# Patient Record
Sex: Female | Born: 1995
Health system: Southern US, Community
[De-identification: ages and names within clinical notes are randomized; demographics above are authoritative.]

## PROBLEM LIST (undated history)

## (undated) DIAGNOSIS — K219 Gastro-esophageal reflux disease without esophagitis: Secondary | ICD-10-CM

---
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
## 2016-07-26 ENCOUNTER — Encounter: Payer: BLUE CROSS/BLUE SHIELD | Admitting: Nurse Practitioner

## 2016-12-05 ENCOUNTER — Encounter: Payer: Self-pay | Admitting: Nurse Practitioner

## 2016-12-05 ENCOUNTER — Ambulatory Visit (INDEPENDENT_AMBULATORY_CARE_PROVIDER_SITE_OTHER): Payer: BLUE CROSS/BLUE SHIELD | Admitting: Nurse Practitioner

## 2016-12-05 VITALS — BP 126/81 | HR 106 | Temp 98.3°F | Ht 62.0 in | Wt 155.0 lb

## 2016-12-05 DIAGNOSIS — N926 Irregular menstruation, unspecified: Secondary | ICD-10-CM | POA: Diagnosis not present

## 2016-12-05 DIAGNOSIS — Z3201 Encounter for pregnancy test, result positive: Secondary | ICD-10-CM | POA: Diagnosis not present

## 2016-12-05 NOTE — Progress Notes (Signed)
   Subjective:    Patient ID: Theresa Hansen, female    DOB: April 23, 1996, 21 y.o.   MRN: [id number]  HPI Patient comes in today c/o nausea and vomiting with slight abdominal pain- started about 1 1/2 months ago. LMP 09/24/16 and was very light. SHe does admitt to unprotected sex.    Review of Systems  Constitutional: Negative.   HENT: Negative.   Respiratory: Negative.   Cardiovascular: Negative.   Gastrointestinal: Positive for nausea and vomiting.  Genitourinary: Negative.   Neurological: Negative.   Psychiatric/Behavioral: Negative.   All other systems reviewed and are negative.      Objective:   Physical Exam  Constitutional: She is oriented to person, place, and time. She appears well-developed and well-nourished. No distress.  Cardiovascular: Normal rate and regular rhythm.   Pulmonary/Chest: Effort normal and breath sounds normal.  Abdominal: Soft. Bowel sounds are normal.  Neurological: She is alert and oriented to person, place, and time.  Skin: Skin is warm.  Psychiatric: She has a normal mood and affect. Her behavior is normal. Judgment and thought content normal.    BP 126/81   Pulse (!) 106   Temp 98.3 F (36.8 C) (Oral)   Ht 5\' 2"  (1.575 m)   Wt 155 lb (70.3 kg)   BMI 28.35 kg/m   Urine pregnancy positive     Assessment & Plan:   1. Missed periods   2. Positive pregnancy test    EDD February 19,2019 Orders Placed This Encounter  Procedures  . Pregnancy, urine  . Ambulatory referral to Obstetrics / Gynecology    Referral Priority:   Routine    Referral Type:   Consultation    Referral Reason:   Specialty Services Required    Requested Specialty:   Obstetrics and Gynecology    Number of Visits Requested:   1   Gummy prenatal vitamins OTC RTO prn  Mary-Margaret Daphine DeutscherMartin, FNP

## 2016-12-05 NOTE — Patient Instructions (Signed)
Eating Plan for Pregnant Women While you are pregnant, your body will require additional nutrition to help support your growing baby. It is recommended that you consume:  150 additional calories each day during your first trimester.  300 additional calories each day during your second trimester.  300 additional calories each day during your third trimester.  Eating a healthy, well-balanced diet is very important for your health and for your baby's health. You also have a higher need for some vitamins and minerals, such as folic acid, calcium, iron, and vitamin D. What do I need to know about eating during pregnancy?  Do not try to lose weight or go on a diet during pregnancy.  Choose healthy, nutritious foods. Choose  of a sandwich with a glass of milk instead of a candy bar or a high-calorie sugar-sweetened beverage.  Limit your overall intake of foods that have "empty calories." These are foods that have little nutritional value, such as sweets, desserts, candies, sugar-sweetened beverages, and fried foods.  Eat a variety of foods, especially fruits and vegetables.  Take a prenatal vitamin to help meet the additional needs during pregnancy, specifically for folic acid, iron, calcium, and vitamin D.  Remember to stay active. Ask your health care provider for exercise recommendations that are specific to you.  Practice good food safety and cleanliness, such as washing your hands before you eat and after you prepare raw meat. This helps to prevent foodborne illnesses, such as listeriosis, that can be very dangerous for your baby. Ask your health care provider for more information about listeriosis. What does 150 extra calories look like? Healthy options for an additional 150 calories each day could be any of the following:  Plain low-fat yogurt (6-8 oz) with  cup of berries.  1 apple with 2 teaspoons of peanut butter.  Cut-up vegetables with  cup of hummus.  Low-fat chocolate  milk (8 oz or 1 cup).  1 string cheese with 1 medium orange.   of a peanut butter and jelly sandwich on whole-wheat bread (1 tsp of peanut butter).  For 300 calories, you could eat two of those healthy options each day. What is a healthy amount of weight to gain? The recommended amount of weight for you to gain is based on your pre-pregnancy BMI. If your pre-pregnancy BMI was:  Less than 18 (underweight), you should gain 28-40 lb.  18-24.9 (normal), you should gain 25-35 lb.  25-29.9 (overweight), you should gain 15-25 lb.  Greater than 30 (obese), you should gain 11-20 lb.  What if I am having twins or multiples? Generally, pregnant women who will be having twins or multiples may need to increase their daily calories by 300-600 calories each day. The recommended range for total weight gain is 25-54 lb, depending on your pre-pregnancy BMI. Talk with your health care provider for specific guidance about additional nutritional needs, weight gain, and exercise during your pregnancy. What foods can I eat? Grains Any grains. Try to choose whole grains, such as whole-wheat bread, oatmeal, or brown rice. Vegetables Any vegetables. Try to eat a variety of colors and types of vegetables to get a full range of vitamins and minerals. Remember to wash your vegetables well before eating. Fruits Any fruits. Try to eat a variety of colors and types of fruit to get a full range of vitamins and minerals. Remember to wash your fruits well before eating. Meats and Other Protein Sources Lean meats, including chicken, turkey, fish, and lean cuts of beef, veal,   or pork. Make sure that all meats are cooked to "well done." Tofu. Tempeh. Beans. Eggs. Peanut butter and other nut butters. Seafood, such as shrimp, crab, and lobster. If you choose fish, select types that are higher in omega-3 fatty acids, including salmon, herring, mussels, trout, sardines, and pollock. Make sure that all meats are cooked to  food-safe temperatures. Dairy Pasteurized milk and milk alternatives. Pasteurized yogurt and pasteurized cheese. Cottage cheese. Sour cream. Beverages Water. Juices that contain 100% fruit juice or vegetable juice. Caffeine-free teas and decaffeinated coffee. Drinks that contain caffeine are okay to drink, but it is better to avoid caffeine. Keep your total caffeine intake to less than 200 mg each day (12 oz of coffee, tea, or soda) or as directed by your health care provider. Condiments Any pasteurized condiments. Sweets and Desserts Any sweets and desserts. Fats and Oils Any fats and oils. The items listed above may not be a complete list of recommended foods or beverages. Contact your dietitian for more options. What foods are not recommended? Vegetables Unpasteurized (raw) vegetable juices. Fruits Unpasteurized (raw) fruit juices. Meats and Other Protein Sources Cured meats that have nitrates, such as bacon, salami, and hotdogs. Luncheon meats, bologna, or other deli meats (unless they are reheated until they are steaming hot). Refrigerated pate, meat spreads from a meat counter, smoked seafood that is found in the refrigerated section of a store. Raw fish, such as sushi or sashimi. High mercury content fish, such as tilefish, shark, swordfish, and king mackerel. Raw meats, such as tuna or beef tartare. Undercooked meats and poultry. Make sure that all meats are cooked to food-safe temperatures. Dairy Unpasteurized (raw) milk and any foods that have raw milk in them. Soft cheeses, such as feta, queso blanco, queso fresco, Brie, Camembert cheeses, blue-veined cheeses, and Panela cheese (unless it is made with pasteurized milk, which must be stated on the label). Beverages Alcohol. Sugar-sweetened beverages, such as sodas, teas, or energy drinks. Condiments Homemade fermented foods and drinks, such as pickles, sauerkraut, or kombucha drinks. (Store-bought pasteurized versions of these are  okay.) Other Salads that are made in the store, such as ham salad, chicken salad, egg salad, tuna salad, and seafood salad. The items listed above may not be a complete list of foods and beverages to avoid. Contact your dietitian for more information. This information is not intended to replace advice given to you by your health care provider. Make sure you discuss any questions you have with your health care provider. Document Released: 02/14/2014 Document Revised: 10/08/2015 Document Reviewed: 10/15/2013 Elsevier Interactive Patient Education  2018 [employer] Inc[employer]   

## 2016-12-07 LAB — PREGNANCY, URINE: Preg Test, Ur: POSITIVE — AB

## 2016-12-13 ENCOUNTER — Telehealth: Payer: Self-pay | Admitting: Nurse Practitioner

## 2016-12-13 NOTE — Telephone Encounter (Signed)
lmtcb To inform pt that referral is in process Dr. Corliss BlackerMcNeill works Family Medicine, she does not work OB/GYN

## 2016-12-13 NOTE — Telephone Encounter (Signed)
Aware that this has been initiated and being worked on

## 2016-12-27 DIAGNOSIS — N911 Secondary amenorrhea: Secondary | ICD-10-CM | POA: Diagnosis not present

## 2016-12-27 DIAGNOSIS — Z363 Encounter for antenatal screening for malformations: Secondary | ICD-10-CM | POA: Diagnosis not present

## 2016-12-29 DIAGNOSIS — Z3A17 17 weeks gestation of pregnancy: Secondary | ICD-10-CM | POA: Diagnosis not present

## 2016-12-29 DIAGNOSIS — Z3480 Encounter for supervision of other normal pregnancy, unspecified trimester: Secondary | ICD-10-CM | POA: Diagnosis not present

## 2016-12-29 DIAGNOSIS — Z3685 Encounter for antenatal screening for Streptococcus B: Secondary | ICD-10-CM | POA: Diagnosis not present

## 2016-12-29 DIAGNOSIS — Z3401 Encounter for supervision of normal first pregnancy, first trimester: Secondary | ICD-10-CM | POA: Diagnosis not present

## 2016-12-29 LAB — OB RESULTS CONSOLE HEPATITIS B SURFACE ANTIGEN: Hepatitis B Surface Ag: NEGATIVE

## 2016-12-29 LAB — OB RESULTS CONSOLE GC/CHLAMYDIA
Chlamydia: NEGATIVE
Gonorrhea: NEGATIVE

## 2016-12-29 LAB — OB RESULTS CONSOLE ABO/RH: RH Type: NEGATIVE

## 2016-12-29 LAB — OB RESULTS CONSOLE RPR: RPR: NONREACTIVE

## 2016-12-29 LAB — OB RESULTS CONSOLE RUBELLA ANTIBODY, IGM: Rubella: IMMUNE

## 2016-12-29 LAB — OB RESULTS CONSOLE HIV ANTIBODY (ROUTINE TESTING): HIV: NONREACTIVE

## 2016-12-29 LAB — OB RESULTS CONSOLE ANTIBODY SCREEN: Antibody Screen: NEGATIVE

## 2017-01-09 DIAGNOSIS — Z3A19 19 weeks gestation of pregnancy: Secondary | ICD-10-CM | POA: Diagnosis not present

## 2017-01-09 DIAGNOSIS — Z3480 Encounter for supervision of other normal pregnancy, unspecified trimester: Secondary | ICD-10-CM | POA: Diagnosis not present

## 2017-01-09 DIAGNOSIS — Z113 Encounter for screening for infections with a predominantly sexual mode of transmission: Secondary | ICD-10-CM | POA: Diagnosis not present

## 2017-01-09 DIAGNOSIS — Z363 Encounter for antenatal screening for malformations: Secondary | ICD-10-CM | POA: Diagnosis not present

## 2017-02-09 DIAGNOSIS — Z362 Encounter for other antenatal screening follow-up: Secondary | ICD-10-CM | POA: Diagnosis not present

## 2017-03-06 DIAGNOSIS — Z3480 Encounter for supervision of other normal pregnancy, unspecified trimester: Secondary | ICD-10-CM | POA: Diagnosis not present

## 2017-03-06 DIAGNOSIS — Z23 Encounter for immunization: Secondary | ICD-10-CM | POA: Diagnosis not present

## 2017-03-06 DIAGNOSIS — Z3A27 27 weeks gestation of pregnancy: Secondary | ICD-10-CM | POA: Diagnosis not present

## 2017-03-06 DIAGNOSIS — O360920 Maternal care for other rhesus isoimmunization, second trimester, not applicable or unspecified: Secondary | ICD-10-CM | POA: Diagnosis not present

## 2017-04-30 LAB — OB RESULTS CONSOLE GBS: GBS: NEGATIVE

## 2017-05-04 DIAGNOSIS — Z3685 Encounter for antenatal screening for Streptococcus B: Secondary | ICD-10-CM | POA: Diagnosis not present

## 2017-05-04 DIAGNOSIS — Z113 Encounter for screening for infections with a predominantly sexual mode of transmission: Secondary | ICD-10-CM | POA: Diagnosis not present

## 2017-05-17 ENCOUNTER — Encounter (HOSPITAL_COMMUNITY): Payer: Self-pay | Admitting: *Deleted

## 2017-05-17 ENCOUNTER — Telehealth (HOSPITAL_COMMUNITY): Payer: Self-pay | Admitting: *Deleted

## 2017-05-17 NOTE — Telephone Encounter (Signed)
Preadmission screen  

## 2017-05-29 NOTE — H&P (Signed)
Theresa Hansen is a 22 y.o. female presenting for IOL w/favorable cervix. OB History    Gravida Para Term Preterm AB Living   1             SAB TAB Ectopic Multiple Live Births                 No past medical history on file. No past surgical history on file. Family History: family history includes Diabetes in her paternal grandmother; Heart disease in her maternal grandfather and maternal grandmother; Hyperlipidemia in her father and mother; Hypertension in her maternal grandfather, maternal grandmother, paternal grandfather, and paternal grandmother; Seizures in her maternal grandmother; Stroke in her paternal grandmother; Thyroid disease in her paternal grandmother. Social History:  reports that  has never smoked. she has never used smokeless tobacco. Her alcohol and drug histories are not on file.     Maternal Diabetes: No Genetic Screening: Normal Maternal Ultrasounds/Referrals: Normal Fetal Ultrasounds or other Referrals:  None Maternal Substance Abuse:  No Significant Maternal Medications:  None Significant Maternal Lab Results:  None Other Comments:  None  ROS History   Last menstrual period 08/27/2016. Exam Physical Exam  (from clinic) NAD, A&O NWOB Abd soft, nondistended, gravid SVE3/70/-2  Prenatal labs: ABO, Rh: O/Negative/-- (08/16 0000) Antibody: Negative (08/16 0000) Rubella: Immune (08/16 0000) RPR: Nonreactive (08/16 0000)  HBsAg: Negative (08/16 0000)  HIV: Non-reactive (08/16 0000)  GBS: Negative (12/16 0000)   Assessment/Plan: 21yo G1P0 @ 39.3wga presenting for IOL w/favorable cervix.  RH neg - s/p rhogam @ 28wga, for eval pp. [facility] Plan pitocin/AROM. GBS neg.     Theresa Hansen 05/29/2017, 10:08 AM

## 2017-05-30 ENCOUNTER — Encounter (HOSPITAL_COMMUNITY): Payer: Self-pay

## 2017-05-30 ENCOUNTER — Inpatient Hospital Stay (HOSPITAL_COMMUNITY)
Admission: RE | Admit: 2017-05-30 | Discharge: 2017-06-02 | DRG: 787 | Disposition: A | Discharge status: Home / Self Care | Payer: BLUE CROSS/BLUE SHIELD | Source: Ambulatory Visit | Attending: Obstetrics and Gynecology | Admitting: Obstetrics and Gynecology

## 2017-05-30 ENCOUNTER — Inpatient Hospital Stay (HOSPITAL_COMMUNITY): Payer: BLUE CROSS/BLUE SHIELD | Admitting: Anesthesiology

## 2017-05-30 ENCOUNTER — Encounter (HOSPITAL_COMMUNITY)
Admission: RE | Disposition: A | Discharge status: Home / Self Care | Payer: Self-pay | Source: Ambulatory Visit | Attending: Obstetrics and Gynecology

## 2017-05-30 DIAGNOSIS — O26893 Other specified pregnancy related conditions, third trimester: Principal | ICD-10-CM | POA: Diagnosis present

## 2017-05-30 DIAGNOSIS — Z3A39 39 weeks gestation of pregnancy: Secondary | ICD-10-CM | POA: Diagnosis not present

## 2017-05-30 DIAGNOSIS — Z6791 Unspecified blood type, Rh negative: Secondary | ICD-10-CM | POA: Diagnosis not present

## 2017-05-30 DIAGNOSIS — Z3490 Encounter for supervision of normal pregnancy, unspecified, unspecified trimester: Secondary | ICD-10-CM

## 2017-05-30 DIAGNOSIS — Z885 Allergy status to narcotic agent status: Secondary | ICD-10-CM | POA: Diagnosis not present

## 2017-05-30 LAB — TYPE AND SCREEN
ABO/RH(D): O NEG
Antibody Screen: NEGATIVE

## 2017-05-30 LAB — CBC
HCT: 35.3 % — ABNORMAL LOW (ref 36.0–46.0)
HEMOGLOBIN: 11.8 g/dL — AB (ref 12.0–15.0)
MCH: 28.6 pg (ref 26.0–34.0)
MCHC: 33.4 g/dL (ref 30.0–36.0)
MCV: 85.7 fL (ref 78.0–100.0)
PLATELETS: 210 10*3/uL (ref 150–400)
RBC: 4.12 MIL/uL (ref 3.87–5.11)
RDW: 13.1 % (ref 11.5–15.5)
WBC: 14 10*3/uL — AB (ref 4.0–10.5)

## 2017-05-30 LAB — RPR: RPR: NONREACTIVE

## 2017-05-30 LAB — ABO/RH: ABO/RH(D): O NEG

## 2017-05-30 SURGERY — Surgical Case
Anesthesia: Epidural | Site: Abdomen

## 2017-05-30 MED ORDER — EPHEDRINE 5 MG/ML INJ: 10.0000 mg | INTRAVENOUS | Status: DC | PRN

## 2017-05-30 MED ORDER — DEXAMETHASONE SODIUM PHOSPHATE 10 MG/ML IJ SOLN
INTRAMUSCULAR | Status: DC | PRN
  Administered 2017-05-30: 5 mg via INTRAVENOUS

## 2017-05-30 MED ORDER — DIPHENHYDRAMINE HCL 50 MG/ML IJ SOLN: 12.5000 mg | INTRAMUSCULAR | Status: DC | PRN

## 2017-05-30 MED ORDER — SODIUM CHLORIDE 0.9 % IR SOLN
Status: DC | PRN
  Administered 2017-05-30: 1

## 2017-05-30 MED ORDER — LIDOCAINE HCL (PF) 1 % IJ SOLN: 30.0000 mL | INTRAMUSCULAR | Status: DC | PRN

## 2017-05-30 MED ORDER — CEFAZOLIN SODIUM-DEXTROSE 2-4 GM/100ML-% IV SOLN
INTRAVENOUS | Status: AC
  Filled 2017-05-30: qty 100

## 2017-05-30 MED ORDER — CEFAZOLIN (ANCEF) 1 G IV SOLR: 2.0000 g | INTRAVENOUS | Status: DC

## 2017-05-30 MED ORDER — MEPERIDINE HCL 25 MG/ML IJ SOLN
INTRAMUSCULAR | Status: DC | PRN
  Administered 2017-05-30 (×2): 12.5 mg via INTRAVENOUS

## 2017-05-30 MED ORDER — MORPHINE SULFATE (PF) 0.5 MG/ML IJ SOLN
INTRAMUSCULAR | Status: DC | PRN
  Administered 2017-05-30: 4 mg via EPIDURAL

## 2017-05-30 MED ORDER — OXYTOCIN 10 UNIT/ML IJ SOLN
INTRAMUSCULAR | Status: AC
  Filled 2017-05-30: qty 4

## 2017-05-30 MED ORDER — LACTATED RINGERS IV SOLN: INTRAVENOUS | Status: DC

## 2017-05-30 MED ORDER — ONDANSETRON HCL 4 MG/2ML IJ SOLN
INTRAMUSCULAR | Status: AC
Start: 2017-05-30 — End: 2017-05-30
  Filled 2017-05-30: qty 2

## 2017-05-30 MED ORDER — LIDOCAINE-EPINEPHRINE (PF) 2 %-1:200000 IJ SOLN
INTRAMUSCULAR | Status: DC | PRN
  Administered 2017-05-30 (×4): 5 mL via EPIDURAL

## 2017-05-30 MED ORDER — FENTANYL 2.5 MCG/ML BUPIVACAINE 1/10 % EPIDURAL INFUSION (WH - ANES)
14.0000 mL/h | INTRAMUSCULAR | Status: DC | PRN
  Administered 2017-05-30 (×2): 14 mL/h via EPIDURAL
  Filled 2017-05-30 (×2): qty 100

## 2017-05-30 MED ORDER — OXYTOCIN 10 UNIT/ML IJ SOLN
INTRAVENOUS | Status: DC | PRN
  Administered 2017-05-30: 40 [IU] via INTRAVENOUS

## 2017-05-30 MED ORDER — TERBUTALINE SULFATE 1 MG/ML IJ SOLN: 0.2500 mg | Freq: Once | INTRAMUSCULAR | Status: DC | PRN

## 2017-05-30 MED ORDER — ACETAMINOPHEN 500 MG PO TABS: 1000.0000 mg | ORAL_TABLET | Freq: Four times a day (QID) | ORAL | Status: DC

## 2017-05-30 MED ORDER — MEPERIDINE HCL 25 MG/ML IJ SOLN
INTRAMUSCULAR | Status: AC
  Filled 2017-05-30: qty 1

## 2017-05-30 MED ORDER — METOCLOPRAMIDE HCL 5 MG/ML IJ SOLN: 10.0000 mg | Freq: Once | INTRAMUSCULAR | Status: DC | PRN

## 2017-05-30 MED ORDER — MEPERIDINE HCL 25 MG/ML IJ SOLN: 6.2500 mg | INTRAMUSCULAR | Status: DC | PRN

## 2017-05-30 MED ORDER — LACTATED RINGERS IV SOLN: 500.0000 mL | Freq: Once | INTRAVENOUS | Status: DC

## 2017-05-30 MED ORDER — ONDANSETRON HCL 4 MG/2ML IJ SOLN
INTRAMUSCULAR | Status: DC | PRN
  Administered 2017-05-30: 4 mg via INTRAVENOUS

## 2017-05-30 MED ORDER — CALCIUM CARBONATE ANTACID 500 MG PO CHEW
2.0000 | CHEWABLE_TABLET | Freq: Two times a day (BID) | ORAL | Status: DC
  Administered 2017-05-30: 400 mg via ORAL
  Filled 2017-05-30: qty 2

## 2017-05-30 MED ORDER — PHENYLEPHRINE 40 MCG/ML (10ML) SYRINGE FOR IV PUSH (FOR BLOOD PRESSURE SUPPORT)
PREFILLED_SYRINGE | INTRAVENOUS | Status: AC
Start: 2017-05-30 — End: 2017-05-30
  Filled 2017-05-30: qty 20

## 2017-05-30 MED ORDER — FLEET ENEMA 7-19 GM/118ML RE ENEM: 1.0000 | ENEMA | RECTAL | Status: DC | PRN

## 2017-05-30 MED ORDER — DEXAMETHASONE SODIUM PHOSPHATE 10 MG/ML IJ SOLN
INTRAMUSCULAR | Status: AC
  Filled 2017-05-30: qty 1

## 2017-05-30 MED ORDER — OXYTOCIN 40 UNITS IN LACTATED RINGERS INFUSION - SIMPLE MED
1.0000 m[IU]/min | INTRAVENOUS | Status: DC
  Administered 2017-05-30: 2 m[IU]/min via INTRAVENOUS
  Filled 2017-05-30: qty 1000

## 2017-05-30 MED ORDER — SODIUM CHLORIDE 0.9% FLUSH: 3.0000 mL | INTRAVENOUS | Status: DC | PRN

## 2017-05-30 MED ORDER — SCOPOLAMINE 1 MG/3DAYS TD PT72: 1.0000 | MEDICATED_PATCH | Freq: Once | TRANSDERMAL | Status: DC

## 2017-05-30 MED ORDER — KETOROLAC TROMETHAMINE 30 MG/ML IJ SOLN
30.0000 mg | Freq: Four times a day (QID) | INTRAMUSCULAR | Status: DC | PRN
  Administered 2017-05-31: 30 mg via INTRAMUSCULAR

## 2017-05-30 MED ORDER — OXYTOCIN 40 UNITS IN LACTATED RINGERS INFUSION - SIMPLE MED: 2.5000 [IU]/h | INTRAVENOUS | Status: DC

## 2017-05-30 MED ORDER — CEFAZOLIN SODIUM-DEXTROSE 2-4 GM/100ML-% IV SOLN
2.0000 g | INTRAVENOUS | Status: AC
  Administered 2017-05-30: 2 g via INTRAVENOUS

## 2017-05-30 MED ORDER — LACTATED RINGERS IV SOLN
INTRAVENOUS | Status: DC | PRN
  Administered 2017-05-30: 22:00:00 via INTRAVENOUS

## 2017-05-30 MED ORDER — LACTATED RINGERS IV SOLN: 500.0000 mL | INTRAVENOUS | Status: DC | PRN

## 2017-05-30 MED ORDER — NALOXONE HCL 0.4 MG/ML IJ SOLN: 0.4000 mg | INTRAMUSCULAR | Status: DC | PRN

## 2017-05-30 MED ORDER — KETOROLAC TROMETHAMINE 30 MG/ML IJ SOLN: 30.0000 mg | Freq: Four times a day (QID) | INTRAMUSCULAR | Status: DC | PRN

## 2017-05-30 MED ORDER — NALOXONE HCL 4 MG/10ML IJ SOLN
1.0000 ug/kg/h | INTRAVENOUS | Status: DC | PRN
  Filled 2017-05-30: qty 5

## 2017-05-30 MED ORDER — ONDANSETRON HCL 4 MG/2ML IJ SOLN: 4.0000 mg | Freq: Three times a day (TID) | INTRAMUSCULAR | Status: DC | PRN

## 2017-05-30 MED ORDER — PHENYLEPHRINE 40 MCG/ML (10ML) SYRINGE FOR IV PUSH (FOR BLOOD PRESSURE SUPPORT): 80.0000 ug | PREFILLED_SYRINGE | INTRAVENOUS | Status: DC | PRN

## 2017-05-30 MED ORDER — FENTANYL CITRATE (PF) 100 MCG/2ML IJ SOLN: 25.0000 ug | INTRAMUSCULAR | Status: DC | PRN

## 2017-05-30 MED ORDER — ACETAMINOPHEN 325 MG PO TABS: 650.0000 mg | ORAL_TABLET | ORAL | Status: DC | PRN

## 2017-05-30 MED ORDER — DIPHENHYDRAMINE HCL 25 MG PO CAPS
25.0000 mg | ORAL_CAPSULE | ORAL | Status: DC | PRN
  Filled 2017-05-30: qty 1

## 2017-05-30 MED ORDER — SCOPOLAMINE 1 MG/3DAYS TD PT72
MEDICATED_PATCH | TRANSDERMAL | Status: AC
  Filled 2017-05-30: qty 1

## 2017-05-30 MED ORDER — MORPHINE SULFATE (PF) 0.5 MG/ML IJ SOLN
INTRAMUSCULAR | Status: AC
  Filled 2017-05-30: qty 10

## 2017-05-30 MED ORDER — SCOPOLAMINE 1 MG/3DAYS TD PT72
MEDICATED_PATCH | TRANSDERMAL | Status: DC | PRN
  Administered 2017-05-30: 1 via TRANSDERMAL

## 2017-05-30 MED ORDER — PHENYLEPHRINE 40 MCG/ML (10ML) SYRINGE FOR IV PUSH (FOR BLOOD PRESSURE SUPPORT)
80.0000 ug | PREFILLED_SYRINGE | INTRAVENOUS | Status: DC | PRN
  Filled 2017-05-30: qty 10

## 2017-05-30 MED ORDER — OXYTOCIN BOLUS FROM INFUSION: 500.0000 mL | Freq: Once | INTRAVENOUS | Status: DC

## 2017-05-30 MED ORDER — ONDANSETRON HCL 4 MG/2ML IJ SOLN
4.0000 mg | Freq: Four times a day (QID) | INTRAMUSCULAR | Status: DC | PRN
  Administered 2017-05-30: 4 mg via INTRAVENOUS
  Filled 2017-05-30: qty 2

## 2017-05-30 MED ORDER — LIDOCAINE HCL (PF) 1 % IJ SOLN
INTRAMUSCULAR | Status: DC | PRN
  Administered 2017-05-30 (×2): 5 mL via EPIDURAL

## 2017-05-30 MED ORDER — SOD CITRATE-CITRIC ACID 500-334 MG/5ML PO SOLN
30.0000 mL | ORAL | Status: DC | PRN
  Administered 2017-05-30: 30 mL via ORAL
  Filled 2017-05-30: qty 15

## 2017-05-30 MED ORDER — PHENYLEPHRINE HCL 10 MG/ML IJ SOLN
INTRAMUSCULAR | Status: DC | PRN
  Administered 2017-05-30 (×2): 80 ug via INTRAVENOUS

## 2017-05-30 SURGICAL SUPPLY — 37 items
ADH SKN CLS APL DERMABOND .7 (GAUZE/BANDAGES/DRESSINGS) ×1
APL SKNCLS STERI-STRIP NONHPOA (GAUZE/BANDAGES/DRESSINGS) ×1
BENZOIN TINCTURE PRP APPL 2/3 (GAUZE/BANDAGES/DRESSINGS) ×1 IMPLANT
CHLORAPREP W/TINT 26ML (MISCELLANEOUS) ×2 IMPLANT
CLAMP CORD UMBIL (MISCELLANEOUS) IMPLANT
CLOSURE STERI STRIP 1/2 X4 (GAUZE/BANDAGES/DRESSINGS) ×1 IMPLANT
CLOTH BEACON ORANGE TIMEOUT ST (SAFETY) ×2 IMPLANT
DERMABOND ADVANCED (GAUZE/BANDAGES/DRESSINGS) ×1
DERMABOND ADVANCED .7 DNX12 (GAUZE/BANDAGES/DRESSINGS) ×1 IMPLANT
DRSG OPSITE POSTOP 4X10 (GAUZE/BANDAGES/DRESSINGS) ×2 IMPLANT
ELECT REM PT RETURN 9FT ADLT (ELECTROSURGICAL) ×2
ELECTRODE REM PT RTRN 9FT ADLT (ELECTROSURGICAL) ×1 IMPLANT
EXTRACTOR VACUUM KIWI (MISCELLANEOUS) ×1 IMPLANT
GLOVE BIO SURGEON STRL SZ 6.5 (GLOVE) ×2 IMPLANT
GLOVE BIOGEL PI IND STRL 6.5 (GLOVE) ×1 IMPLANT
GLOVE BIOGEL PI IND STRL 7.0 (GLOVE) ×2 IMPLANT
GLOVE BIOGEL PI INDICATOR 6.5 (GLOVE) ×1
GLOVE BIOGEL PI INDICATOR 7.0 (GLOVE) ×2
GOWN STRL REUS W/TWL LRG LVL3 (GOWN DISPOSABLE) ×4 IMPLANT
KIT ABG SYR 3ML LUER SLIP (SYRINGE) ×2 IMPLANT
NDL HYPO 25X5/8 SAFETYGLIDE (NEEDLE) ×1 IMPLANT
NEEDLE HYPO 25X5/8 SAFETYGLIDE (NEEDLE) ×2 IMPLANT
NS IRRIG 1000ML POUR BTL (IV SOLUTION) ×2 IMPLANT
PACK C SECTION WH (CUSTOM PROCEDURE TRAY) ×2 IMPLANT
PAD OB MATERNITY 4.3X12.25 (PERSONAL CARE ITEMS) ×2 IMPLANT
PENCIL SMOKE EVAC W/HOLSTER (ELECTROSURGICAL) ×2 IMPLANT
RETRACTOR WND ALEXIS 25 LRG (MISCELLANEOUS) IMPLANT
RTRCTR WOUND ALEXIS 25CM LRG (MISCELLANEOUS)
SUT PLAIN 0 NONE (SUTURE) IMPLANT
SUT PLAIN 2 0 (SUTURE) ×2
SUT PLAIN ABS 2-0 CT1 27XMFL (SUTURE) ×1 IMPLANT
SUT VIC AB 0 CT1 36 (SUTURE) ×2 IMPLANT
SUT VIC AB 0 CTX 36 (SUTURE) ×4
SUT VIC AB 0 CTX36XBRD ANBCTRL (SUTURE) ×2 IMPLANT
SUT VIC AB 4-0 PS2 27 (SUTURE) ×2 IMPLANT
TOWEL OR 17X24 6PK STRL BLUE (TOWEL DISPOSABLE) ×2 IMPLANT
TRAY FOLEY BAG SILVER LF 14FR (SET/KITS/TRAYS/PACK) IMPLANT

## 2017-05-30 NOTE — Brief Op Note (Signed)
05/30/2017  11:06 PM  PATIENT:  Theresa MaiersAshley M Hansen  21 y.o. female  PRE-OPERATIVE DIAGNOSIS:  Arrest of Dilation  POST-OPERATIVE DIAGNOSIS:  Arrest of Dilation  PROCEDURE:  Procedure(s): CESAREAN SECTION (N/A)  SURGEON:  Surgeon(s) and Role:    * Ranae PilaLeger, Elise Jennifer, MD - Primary  PHYSICIAN ASSISTANT:   ASSISTANTS: none   ANESTHESIA:   epidural  EBL:  724 mL   BLOOD ADMINISTERED:none  DRAINS: Urinary Catheter (Foley)   LOCAL MEDICATIONS USED:  NONE  SPECIMEN:  Source of Specimen:  placenta  DISPOSITION OF SPECIMEN:  routine  COUNTS:  YES  TOURNIQUET:  * No tourniquets in log *  DICTATION: .Note written in EPIC  PLAN OF CARE: Admit to inpatient   PATIENT DISPOSITION:  PACU - hemodynamically stable.   Delay start of Pharmacological VTE agent (>24hrs) due to surgical blood loss or risk of bleeding: not applicable

## 2017-05-30 NOTE — Progress Notes (Signed)
9/c/0, great deal of caput. OP --> rotated to OA.  IUPC placed Continue pitocin titration.    Rosie FateE Brondon Wann MD

## 2017-05-30 NOTE — Anesthesia Pain Management Evaluation Note (Signed)
  CRNA Pain Management Visit Note  Patient: Theresa Hansen, 22 y.o., female  "Hello [facility] am a [facility]. We have an anesthesia team available at all times to provide care throughout the hospital, including epidural management and anesthesia for C-section. I don't know your plan for the delivery whether it a natural birth, water birth, IV sedation, nitrous supplementation, doula or epidural, but we want to meet your pain goals."   1.Was your pain managed to your expectations on prior hospitalizations?   No prior hospitalizations  2.What is your expectation for pain management during this hospitalization?     Epidural  3.How can we help you reach that goal? Patient wants epidural now. RN informed.  Record the patient's initial score and the patient's pain goal.   Pain: 5  Pain Goal: 4 [facility] wants you to be able to say your pain was always managed very well.  Colter Magowan 05/30/2017

## 2017-05-30 NOTE — Transfer of Care (Signed)
Immediate Anesthesia Transfer of Care Note  Patient: Theresa Hansen  Procedure(s) Performed: CESAREAN SECTION (N/A Abdomen)  Patient Location: PACU  Anesthesia Type:Epidural  Level of Consciousness: awake, alert  and oriented  Airway & Oxygen Therapy: Patient Spontanous Breathing  Post-op Assessment: Report given to RN and Post -op Vital signs reviewed and stable  Post vital signs: Reviewed and stable BP 113/53, SaO2 99%, 128, RR 16  Last Vitals:  Vitals:   05/30/17 2031 05/30/17 2033  BP: 108/66   Pulse: (!) 104   Resp: 16   Temp:  37.8 C  SpO2:      Last Pain:  Vitals:   05/30/17 2033  TempSrc: Oral  PainSc:          Complications: No apparent anesthesia complications

## 2017-05-30 NOTE — Progress Notes (Signed)
4/70/-2, AROM for clear fluid Pitocin

## 2017-05-30 NOTE — Progress Notes (Signed)
Patient's cervix remains unchanged. Now ~3 hours at 9/c/0 with adequate contractions. Cervix starting to feel swollen and caput is quite significant - vertex feels asynclitic. Temperature in low 100s - concern for impending chorioamnionitis. Given clinical picture, recommend proceeding with primary CS s/s arrest of dilation. Risks discussed including infection, bleeding, damage to surrounding structures, need for additional procedures, and the possibility of post operative complications. All questions answered. Consent signed. Proceed with CS. 2g ancef on call to OR.    Belva AgeeElise Leger MD

## 2017-05-30 NOTE — Op Note (Signed)
PROCEDURE DATE: 05/30/17  PREOPERATIVE DIAGNOSIS: Intrauterine pregnancy at 39.3 wga, Indication: arrest of dilation  POSTOPERATIVE DIAGNOSIS:The same  PROCEDURE: Primary Low TransverseCesarean Section  SURGEON: Dr. Belva AgeeElise Avry Roedl  INDICATIONS:This is a 22 yo G1P0 at 4739.3 wga requiring cesarean section secondary to arrest of dilation. Progressed well in labor but then remained 9cm x ~ 3 hours with adequate contractions - vertex was high and coming down asynclitic despite multiple repositioning efforts. She then began to run a low grade temperature but never actually spiked. FHT with mild tachycardia to the 170s. Decision made to proceed with pLTCS.The risks of cesarean section discussed with the patient included but were not limited to: bleeding which may require transfusion or reoperation; infection which may require antibiotics; injury to bowel, bladder, ureters or other surrounding organs; injury to the fetus; need for additional procedures including hysterectomy in the event of a life-threatening hemorrhage; placental abnormalities wth subsequent pregnancies, incisional problems, thromboembolic phenomenon and other postoperative/anesthesia complications. The patient agreed with the proposed plan, giving informed consent for the procedure.   FINDINGS: Viable femaleinfant in vertex, OP presentation,APGARs pending, Weight pending, Amniotic fluid  clear, Intact placenta, three vessel cord. Grossly normal uterus, ovaries and fallopian tubes.  .  ANESTHESIA: Epidural ESTIMATED BLOOD LOSS: 724cc SPECIMENS: Placenta for routine COMPLICATIONS: None immediate  PROCEDURE IN DETAIL: The patient received intravenous antibiotics (2g Ancef) and had sequential compression devices applied to her lower extremities while in the preoperative area. Shewasthen taken to the operating roomwhere epidural anesthesiawas dosed up to surgical level andwas found to be adequate. She was then  placed in a dorsal supine position with a leftward tilt,and prepped and draped in a sterile manner.A foley catheter was placed into her bladder and attached to constant gravity. After an adequate timeout was performed, aPfannenstiel skin incision was made with scalpel and carried through to the underlying layer of fascia. The fascia was incised in the midline and this incision was extended bilaterally using the Mayo scissors. Kocher clamps were applied to the superior aspect of the fascial incision and the underlying rectus muscles were dissected off bluntly. A similar process was carried out on the inferior aspect of the facial incision. The rectus muscles were separated in the midline bluntly and the peritoneum was entered bluntly. A bladder flap was created sharply and developed bluntly.Atransverse hysterotomy was made with a scalpel and extended bilaterally bluntly. The bladder blade was then removed. The infant was successfully delivered, and cord was clamped and cut and infant was handed over to awaiting neonatology team. Uterine massage was then administered and the placenta delivered intact with three-vessel cord. Cord gases were taken. The uterus was cleared of clot and debris. The hysterotomy was closed with 0 vicryl.A second imbricating suture of 0-vicryl was used to reinforce the incision and aid in hemostasis.The fascia was closed with 0-Vicryl in a running fashion with good restoration of anatomy. The subcutaneus tissue was irrigated and was reapproximated using three interrupted plain gut stitches. The skin was closed with 4-0 Vicryl in a subcuticular fashion.  Final EBL was 724cc (all surgical site and was hemostatic at end of procedure) without any further bleeding on exam.   It's a girl - "Ryleigh"! Patient w/allergy to codeine and is not breastfeeding - thus plan for tramadol.   Pt tolerated the procedure well. All sponge/lap/needle counts were correct X 2. Pt taken to  recovery room in stable condition.   Belva AgeeElise Erbie Arment MD

## 2017-05-30 NOTE — Consult Note (Signed)
Neonatology Note:   Attendance at C-section:    I was asked by Dr. Elon SpannerLeger to attend this C/S at term for FTP. The mother is a G1, GBS neg with good prenatal care. ROM 15 hours before delivery, fluid clear. Infant vigorous with good spontaneous cry and tone. Needed only minimal bulb suctioning. Ap 8/9. Lungs clear to ausc in DR. To CN to care of Pediatrician.  Dineen Kidavid C. Leary RocaEhrmann, MD

## 2017-05-30 NOTE — Anesthesia Procedure Notes (Signed)
Epidural Patient location during procedure: OB Start time: 05/30/2017 9:57 AM End time: 05/30/2017 10:04 AM  Staffing Anesthesiologist: Heather RobertsSinger, Auda Finfrock, MD Performed: anesthesiologist   Preanesthetic Checklist Completed: patient identified, site marked, pre-op evaluation, timeout performed, IV checked, risks and benefits discussed and monitors and equipment checked  Epidural Patient position: sitting Prep: DuraPrep Patient monitoring: heart rate, cardiac monitor, continuous pulse ox and blood pressure Approach: midline Location: L2-L3 Injection technique: LOR saline  Needle:  Needle type: Tuohy  Needle gauge: 17 G Needle length: 9 cm Needle insertion depth: 7 cm Catheter size: 20 Guage Catheter at skin depth: 12 cm Test dose: negative and Other  Assessment Events: blood not aspirated, injection not painful, no injection resistance and negative IV test  Additional Notes Informed consent obtained prior to proceeding including risk of failure, 1% risk of PDPH, risk of minor discomfort and bruising.  Discussed rare but serious complications including epidural abscess, permanent nerve injury, epidural hematoma.  Discussed alternatives to epidural analgesia and patient desires to proceed.  Timeout performed pre-procedure verifying patient name, procedure, and platelet count.  Patient tolerated procedure well.

## 2017-05-30 NOTE — Anesthesia Preprocedure Evaluation (Signed)
Anesthesia Evaluation  Patient identified by MRN, date of birth, ID band Patient awake    Reviewed: Allergy & Precautions, NPO status , Patient's Chart, lab work & pertinent test results  History of Anesthesia Complications Negative for: history of anesthetic complications  Airway Mallampati: II  TM Distance: >3 FB Neck ROM: Full    Dental no notable dental hx. (+) Dental Advisory Given   Pulmonary neg pulmonary ROS,    Pulmonary exam normal        Cardiovascular negative cardio ROS Normal cardiovascular exam     Neuro/Psych negative neurological ROS  negative psych ROS   GI/Hepatic negative GI ROS, Neg liver ROS,   Endo/Other  negative endocrine ROS  Renal/GU negative Renal ROS  negative genitourinary   Musculoskeletal negative musculoskeletal ROS (+)   Abdominal   Peds negative pediatric ROS (+)  Hematology negative hematology ROS (+)   Anesthesia Other Findings   Reproductive/Obstetrics (+) Pregnancy                             Anesthesia Physical Anesthesia Plan  ASA: II  Anesthesia Plan: Epidural   Post-op Pain Management:    Induction:   PONV Risk Score and Plan:   Airway Management Planned: Natural Airway  Additional Equipment:   Intra-op Plan:   Post-operative Plan:   Informed Consent: I have reviewed the patients History and Physical, chart, labs and discussed the procedure including the risks, benefits and alternatives for the proposed anesthesia with the patient or authorized representative who has indicated his/her understanding and acceptance.     Dental advisory given  Plan Discussed with: Anesthesiologist  Anesthesia Plan Comments:         Anesthesia Quick Evaluation  

## 2017-05-30 NOTE — Progress Notes (Signed)
Pt comf w/cle.  Continue pitocin titration, FHT cat 1.

## 2017-05-31 ENCOUNTER — Other Ambulatory Visit: Payer: Self-pay

## 2017-05-31 ENCOUNTER — Encounter (HOSPITAL_COMMUNITY): Payer: Self-pay | Admitting: Obstetrics and Gynecology

## 2017-05-31 LAB — CBC
HCT: 28.8 % — ABNORMAL LOW (ref 36.0–46.0)
Hemoglobin: 9.7 g/dL — ABNORMAL LOW (ref 12.0–15.0)
MCH: 29 pg (ref 26.0–34.0)
MCHC: 33.7 g/dL (ref 30.0–36.0)
MCV: 86.2 fL (ref 78.0–100.0)
Platelets: 195 K/uL (ref 150–400)
RBC: 3.34 MIL/uL — ABNORMAL LOW (ref 3.87–5.11)
RDW: 13.2 % (ref 11.5–15.5)
WBC: 26.7 K/uL — ABNORMAL HIGH (ref 4.0–10.5)

## 2017-05-31 MED ORDER — TETANUS-DIPHTH-ACELL PERTUSSIS 5-2.5-18.5 LF-MCG/0.5 IM SUSP: 0.5000 mL | Freq: Once | INTRAMUSCULAR | Status: DC

## 2017-05-31 MED ORDER — IBUPROFEN 600 MG PO TABS
600.0000 mg | ORAL_TABLET | Freq: Four times a day (QID) | ORAL | Status: DC
  Administered 2017-05-31 – 2017-06-02 (×8): 600 mg via ORAL
  Filled 2017-05-31 (×9): qty 1

## 2017-05-31 MED ORDER — OXYTOCIN 40 UNITS IN LACTATED RINGERS INFUSION - SIMPLE MED: 2.5000 [IU]/h | INTRAVENOUS | Status: AC

## 2017-05-31 MED ORDER — ACETAMINOPHEN 325 MG PO TABS
650.0000 mg | ORAL_TABLET | ORAL | Status: DC | PRN
  Administered 2017-05-31 – 2017-06-02 (×5): 650 mg via ORAL
  Filled 2017-05-31 (×5): qty 2

## 2017-05-31 MED ORDER — NALBUPHINE HCL 10 MG/ML IJ SOLN: 5.0000 mg | Freq: Once | INTRAMUSCULAR | Status: DC | PRN

## 2017-05-31 MED ORDER — SIMETHICONE 80 MG PO CHEW
80.0000 mg | CHEWABLE_TABLET | Freq: Three times a day (TID) | ORAL | Status: DC
  Administered 2017-05-31 – 2017-06-01 (×6): 80 mg via ORAL
  Filled 2017-05-31 (×7): qty 1

## 2017-05-31 MED ORDER — SIMETHICONE 80 MG PO CHEW
80.0000 mg | CHEWABLE_TABLET | ORAL | Status: DC
  Administered 2017-06-01 – 2017-06-02 (×3): 80 mg via ORAL
  Filled 2017-05-31 (×2): qty 1

## 2017-05-31 MED ORDER — DIBUCAINE 1 % RE OINT: 1.0000 "application " | TOPICAL_OINTMENT | RECTAL | Status: DC | PRN

## 2017-05-31 MED ORDER — ZOLPIDEM TARTRATE 5 MG PO TABS: 5.0000 mg | ORAL_TABLET | Freq: Every evening | ORAL | Status: DC | PRN

## 2017-05-31 MED ORDER — NALBUPHINE HCL 10 MG/ML IJ SOLN: 5.0000 mg | INTRAMUSCULAR | Status: DC | PRN

## 2017-05-31 MED ORDER — SIMETHICONE 80 MG PO CHEW: 80.0000 mg | CHEWABLE_TABLET | ORAL | Status: DC | PRN

## 2017-05-31 MED ORDER — WITCH HAZEL-GLYCERIN EX PADS: 1.0000 "application " | MEDICATED_PAD | CUTANEOUS | Status: DC | PRN

## 2017-05-31 MED ORDER — TRAMADOL HCL 50 MG PO TABS: 50.0000 mg | ORAL_TABLET | Freq: Four times a day (QID) | ORAL | Status: DC | PRN

## 2017-05-31 MED ORDER — SENNOSIDES-DOCUSATE SODIUM 8.6-50 MG PO TABS
2.0000 | ORAL_TABLET | ORAL | Status: DC
  Administered 2017-06-01 – 2017-06-02 (×2): 2 via ORAL
  Filled 2017-05-31 (×2): qty 2

## 2017-05-31 MED ORDER — MENTHOL 3 MG MT LOZG: 1.0000 | LOZENGE | OROMUCOSAL | Status: DC | PRN

## 2017-05-31 MED ORDER — KETOROLAC TROMETHAMINE 30 MG/ML IJ SOLN
INTRAMUSCULAR | Status: AC
  Administered 2017-05-31: 30 mg via INTRAMUSCULAR
  Filled 2017-05-31: qty 1

## 2017-05-31 MED ORDER — LACTATED RINGERS IV SOLN
INTRAVENOUS | Status: DC
  Administered 2017-05-31: 06:00:00 via INTRAVENOUS

## 2017-05-31 MED ORDER — DIPHENHYDRAMINE HCL 25 MG PO CAPS: 25.0000 mg | ORAL_CAPSULE | Freq: Four times a day (QID) | ORAL | Status: DC | PRN

## 2017-05-31 MED ORDER — COCONUT OIL OIL
1.0000 | TOPICAL_OIL | Status: DC | PRN
Start: 2017-05-31 — End: 2017-06-02

## 2017-05-31 MED ORDER — PRENATAL MULTIVITAMIN CH
1.0000 | ORAL_TABLET | Freq: Every day | ORAL | Status: DC
  Administered 2017-05-31 – 2017-06-01 (×2): 1 via ORAL
  Filled 2017-05-31 (×3): qty 1

## 2017-05-31 NOTE — Anesthesia Postprocedure Evaluation (Signed)
Anesthesia Post Note  Patient: Theresa Hansen  Procedure(s) Performed: CESAREAN SECTION (N/A Abdomen)     Patient location during evaluation: Mother Baby Anesthesia Type: Epidural Level of consciousness: awake and alert Pain management: pain level controlled Vital Signs Assessment: post-procedure vital signs reviewed and stable Respiratory status: spontaneous breathing, nonlabored ventilation and respiratory function stable Cardiovascular status: stable Postop Assessment: no headache, no backache, epidural receding, no apparent nausea or vomiting, patient able to bend at knees and adequate PO intake Anesthetic complications: no    Last Vitals:  Vitals:   05/31/17 0630 05/31/17 0741  BP:  120/70  Pulse:  75  Resp:  18  Temp:  37.1 C  SpO2: 98%     Last Pain:  Vitals:   05/31/17 0745  TempSrc:   PainSc: 0-No pain   Pain Goal:                 Land O'LakesMalinova,Nickole Adamek Hristova

## 2017-05-31 NOTE — Progress Notes (Signed)
Subjective: Postpartum Day 1: Cesarean Delivery Patient reports tolerating PO.    Objective: Vital signs in last 24 hours: Temp:  [98 F (36.7 C)-100 F (37.8 C)] 98.7 F (37.1 C) (01/16 0741) Pulse Rate:  [75-165] 75 (01/16 0741) Resp:  [14-25] 18 (01/16 0741) BP: (91-158)/(46-98) 120/70 (01/16 0741) SpO2:  [96 %-100 %] 98 % (01/16 0630)  Physical Exam:  General: alert, cooperative and appears stated age Lochia: appropriate Uterine Fundus: firm Incision: healing well, no significant drainage, no dehiscence DVT Evaluation: No evidence of DVT seen on physical exam. Negative Homan's sign. No cords or calf tenderness.  Recent Labs    05/30/17 0724 05/31/17 0545  HGB 11.8* 9.7*  HCT 35.3* 28.8*    Assessment/Plan: Status post Cesarean section. Doing well postoperatively.  Continue current care.  Stepfanie Yott 05/31/2017, 8:33 AM

## 2017-06-01 NOTE — Progress Notes (Signed)
Patient is doing well. No complaints.  BP (!) 103/56 (BP Location: Right Arm)   Pulse 74   Temp 97.8 F (36.6 C) (Oral)   Resp 18   Ht 5\' 4"  (1.626 m)   Wt 83.5 kg (184 lb)   LMP 08/27/2016   SpO2 98%   Breastfeeding? Unknown   BMI 31.58 kg/m   No results found for this or any previous visit (from the past 24 hour(s)). Abdomen is soft and non tender Bandage clean and dry  POD # 2  Doing well Discharge home tomorrow

## 2017-06-02 MED ORDER — TRAMADOL HCL 50 MG PO TABS: 50.0000 mg | ORAL_TABLET | Freq: Four times a day (QID) | ORAL | 0 refills | Status: DC | PRN

## 2017-06-02 MED ORDER — IBUPROFEN 600 MG PO TABS: 600.0000 mg | ORAL_TABLET | Freq: Four times a day (QID) | ORAL | 0 refills | Status: DC | PRN

## 2017-06-02 NOTE — Discharge Summary (Signed)
Obstetric Discharge Summary Reason for Admission: induction of labor Prenatal Procedures: none Intrapartum Procedures: cesarean: low cervical, transverse Postpartum Procedures: none Complications-Operative and Postpartum: none Hemoglobin  Date Value Ref Range Status  05/31/2017 9.7 (L) 12.0 - 15.0 g/dL Final   HCT  Date Value Ref Range Status  05/31/2017 28.8 (L) 36.0 - 46.0 % Final    Physical Exam:  General: alert, cooperative and no distress Lochia: appropriate Uterine Fundus: firm Incision: healing well DVT Evaluation: No evidence of DVT seen on physical exam.  Discharge Diagnoses: Term Pregnancy-delivered  Discharge Information: Date: 06/02/2017 Activity: pelvic rest Diet: routine Medications: PNV, Ibuprofen and tramadol Condition: stable Instructions: refer to practice specific booklet Discharge to: home   Newborn Data: Live born female  Birth Weight: 7 lb 1.1 oz (3206 g) APGAR: 8, 9  Newborn Delivery   Birth date/time:  05/30/2017 22:33:00 Delivery type:  C-Section, Low Transverse C-section categorization:  Primary     Home with mother.  Theresa Hansen 06/02/2017, 8:03 AM

## 2017-06-05 ENCOUNTER — Ambulatory Visit (INDEPENDENT_AMBULATORY_CARE_PROVIDER_SITE_OTHER): Payer: BLUE CROSS/BLUE SHIELD | Admitting: Nurse Practitioner

## 2017-06-05 VITALS — BP 126/81 | HR 79 | Temp 98.8°F | Wt 176.6 lb

## 2017-06-05 DIAGNOSIS — D508 Other iron deficiency anemias: Secondary | ICD-10-CM

## 2017-06-05 LAB — HEMOGLOBIN, FINGERSTICK: Hemoglobin: 9.4 g/dL — ABNORMAL LOW (ref 11.1–15.9)

## 2017-06-05 NOTE — Patient Instructions (Signed)
Anemia Anemia is a condition in which you do not have enough red blood cells or hemoglobin. Hemoglobin is a substance in red blood cells that carries oxygen. When you do not have enough red blood cells or hemoglobin (are anemic), your body cannot get enough oxygen and your organs may not work properly. As a result, you may feel very tired or have other problems. What are the causes? Common causes of anemia include:  Excessive bleeding. Anemia can be caused by excessive bleeding inside or outside the body, including bleeding from the intestine or from periods in women.  Poor nutrition.  Long-lasting (chronic) kidney, thyroid, and liver disease.  Bone marrow disorders.  Cancer and treatments for cancer.  HIV (human immunodeficiency virus) and AIDS (acquired immunodeficiency syndrome).  Treatments for HIV and AIDS.  Spleen problems.  Blood disorders.  Infections, medicines, and autoimmune disorders that destroy red blood cells.  What are the signs or symptoms? Symptoms of this condition include:  Minor weakness.  Dizziness.  Headache.  Feeling heartbeats that are irregular or faster than normal (palpitations).  Shortness of breath, especially with exercise.  Paleness.  Cold sensitivity.  Indigestion.  Nausea.  Difficulty sleeping.  Difficulty concentrating.  Symptoms may occur suddenly or develop slowly. If your anemia is mild, you may not have symptoms. How is this diagnosed? This condition is diagnosed based on:  Blood tests.  Your medical history.  A physical exam.  Bone marrow biopsy.  Your health care provider may also check your stool (feces) for blood and may do additional testing to look for the cause of your bleeding. You may also have other tests, including:  Imaging tests, such as a CT scan or MRI.  Endoscopy.  Colonoscopy.  How is this treated? Treatment for this condition depends on the cause. If you continue to lose a lot of blood,  you may need to be treated at a hospital. Treatment may include:  Taking supplements of iron, vitamin B12, or folic acid.  Taking a hormone medicine (erythropoietin) that can help to stimulate red blood cell growth.  Having a blood transfusion. This may be needed if you lose a lot of blood.  Making changes to your diet.  Having surgery to remove your spleen.  Follow these instructions at home:  Take over-the-counter and prescription medicines only as told by your health care provider.  Take supplements only as told by your health care provider.  Follow any diet instructions that you were given.  Keep all follow-up visits as told by your health care provider. This is important. Contact a health care provider if:  You develop new bleeding anywhere in the body. Get help right away if:  You are very weak.  You are short of breath.  You have pain in your abdomen or chest.  You are dizzy or feel faint.  You have trouble concentrating.  You have bloody or black, tarry stools.  You vomit repeatedly or you vomit up blood. Summary  Anemia is a condition in which you do not have enough red blood cells or enough of a substance in your red blood cells that carries oxygen (hemoglobin).  Symptoms may occur suddenly or develop slowly.  If your anemia is mild, you may not have symptoms.  This condition is diagnosed with blood tests as well as a medical history and physical exam. Other tests may be needed.  Treatment for this condition depends on the cause of the anemia. This information is not intended to replace advice   given to you by your health care provider. Make sure you discuss any questions you have with your health care provider. Document Released: 06/09/2004 Document Revised: 06/03/2016 Document Reviewed: 06/03/2016 Elsevier Interactive Patient Education  [employer].

## 2017-06-05 NOTE — Progress Notes (Signed)
   Subjective:    Patient ID: Theresa Hansen, female    DOB: 03-Jan-1996, 22 y.o.   MRN: [id number]  HPI Patient comes in today with her [place] baby, baby daddy and mom . Patient delivered on Tuesday by c section because mom had fever and babies heart beat was increasing. When she left hospital her white count was 26 and hgb was 9.4 according to chart. She was not discharged on antibiotic. Patient is pale and no energy.    Review of Systems  Constitutional: Positive for activity change and appetite change.  HENT: Negative.   Respiratory: Negative.   Cardiovascular: Negative.   Gastrointestinal: Positive for nausea.  Genitourinary: Negative.   Neurological: Negative.   Psychiatric/Behavioral: Negative.   All other systems reviewed and are negative.      Objective:   Physical Exam  Constitutional: She is oriented to person, place, and time. She appears well-developed and well-nourished. No distress.  Neck: Normal range of motion. Neck supple.  Cardiovascular: Normal rate and regular rhythm.  Pulmonary/Chest: Effort normal and breath sounds normal.  Abdominal: Soft. Bowel sounds are normal.  Neurological: She is alert and oriented to person, place, and time. She has normal reflexes. No cranial nerve deficit.  Skin: Skin is warm. There is pallor.  Psychiatric: She has a normal mood and affect. Her behavior is normal. Judgment and thought content normal.   BP 126/81 (BP Location: Left Arm, Patient Position: Sitting, Cuff Size: Normal)   Pulse 79   Temp 98.8 F (37.1 C) (Oral)   Wt 176 lb 9.6 oz (80.1 kg)   BMI 30.31 kg/m   hgb 9.4      Assessment & Plan:   1. Other iron deficiency anemia    continue prenatal vitamins Will wait on cbc tomorrow to decide what to do Rest Force fluids  Mary-Margaret Daphine DeutscherMartin, FNP

## 2017-06-06 ENCOUNTER — Other Ambulatory Visit: Payer: Self-pay | Admitting: Nurse Practitioner

## 2017-06-06 ENCOUNTER — Other Ambulatory Visit (HOSPITAL_COMMUNITY): Payer: Self-pay | Admitting: Obstetrics and Gynecology

## 2017-06-06 ENCOUNTER — Telehealth: Payer: Self-pay | Admitting: Family Medicine

## 2017-06-06 DIAGNOSIS — R1011 Right upper quadrant pain: Secondary | ICD-10-CM

## 2017-06-06 LAB — CBC WITH DIFFERENTIAL/PLATELET
BASOS ABS: 0 10*3/uL (ref 0.0–0.2)
Basos: 0 %
EOS (ABSOLUTE): 0.2 10*3/uL (ref 0.0–0.4)
Eos: 1 %
HEMOGLOBIN: 9.4 g/dL — AB (ref 11.1–15.9)
Hematocrit: 28.8 % — ABNORMAL LOW (ref 34.0–46.6)
IMMATURE GRANS (ABS): 0.1 10*3/uL (ref 0.0–0.1)
Immature Granulocytes: 1 %
LYMPHS: 8 %
Lymphocytes Absolute: 1.1 10*3/uL (ref 0.7–3.1)
MCH: 27.7 pg (ref 26.6–33.0)
MCHC: 32.6 g/dL (ref 31.5–35.7)
MCV: 85 fL (ref 79–97)
MONOCYTES: 6 %
Monocytes Absolute: 0.8 10*3/uL (ref 0.1–0.9)
Neutrophils Absolute: 10.7 10*3/uL — ABNORMAL HIGH (ref 1.4–7.0)
Neutrophils: 84 %
PLATELETS: 292 10*3/uL (ref 150–379)
RBC: 3.39 x10E6/uL — AB (ref 3.77–5.28)
RDW: 13.8 % (ref 12.3–15.4)
WBC: 12.8 10*3/uL — AB (ref 3.4–10.8)

## 2017-06-06 MED ORDER — CEPHALEXIN 500 MG PO CAPS: 500.0000 mg | ORAL_CAPSULE | Freq: Three times a day (TID) | ORAL | 0 refills | Status: DC

## 2017-06-06 NOTE — Progress Notes (Signed)
keflex

## 2017-06-08 ENCOUNTER — Ambulatory Visit (HOSPITAL_COMMUNITY)
Admission: RE | Admit: 2017-06-08 | Discharge: 2017-06-08 | Disposition: A | Discharge status: Home / Self Care | Payer: BLUE CROSS/BLUE SHIELD | Source: Ambulatory Visit | Attending: Obstetrics and Gynecology | Admitting: Obstetrics and Gynecology

## 2017-06-08 DIAGNOSIS — R1011 Right upper quadrant pain: Secondary | ICD-10-CM | POA: Insufficient documentation

## 2017-06-08 DIAGNOSIS — K8020 Calculus of gallbladder without cholecystitis without obstruction: Secondary | ICD-10-CM | POA: Insufficient documentation

## 2017-06-08 DIAGNOSIS — J90 Pleural effusion, not elsewhere classified: Secondary | ICD-10-CM | POA: Diagnosis not present

## 2017-06-11 ENCOUNTER — Emergency Department (HOSPITAL_COMMUNITY)
Admission: EM | Admit: 2017-06-11 | Discharge: 2017-06-11 | Disposition: A | Discharge status: Home / Self Care | Payer: BLUE CROSS/BLUE SHIELD | Attending: Emergency Medicine | Admitting: Emergency Medicine

## 2017-06-11 ENCOUNTER — Encounter (HOSPITAL_COMMUNITY): Payer: Self-pay | Admitting: *Deleted

## 2017-06-11 ENCOUNTER — Other Ambulatory Visit: Payer: Self-pay

## 2017-06-11 DIAGNOSIS — K047 Periapical abscess without sinus: Secondary | ICD-10-CM | POA: Insufficient documentation

## 2017-06-11 DIAGNOSIS — R509 Fever, unspecified: Secondary | ICD-10-CM | POA: Insufficient documentation

## 2017-06-11 DIAGNOSIS — R220 Localized swelling, mass and lump, head: Secondary | ICD-10-CM | POA: Diagnosis present

## 2017-06-11 LAB — CBC WITH DIFFERENTIAL/PLATELET
Basophils Absolute: 0 10*3/uL (ref 0.0–0.1)
Basophils Relative: 0 %
EOS ABS: 0.1 10*3/uL (ref 0.0–0.7)
EOS PCT: 0 %
HCT: 33 % — ABNORMAL LOW (ref 36.0–46.0)
Hemoglobin: 10.4 g/dL — ABNORMAL LOW (ref 12.0–15.0)
LYMPHS ABS: 1.1 10*3/uL (ref 0.7–4.0)
LYMPHS PCT: 7 %
MCH: 26.9 pg (ref 26.0–34.0)
MCHC: 31.5 g/dL (ref 30.0–36.0)
MCV: 85.5 fL (ref 78.0–100.0)
MONO ABS: 0.8 10*3/uL (ref 0.1–1.0)
MONOS PCT: 5 %
Neutro Abs: 14 10*3/uL — ABNORMAL HIGH (ref 1.7–7.7)
Neutrophils Relative %: 88 %
PLATELETS: 391 10*3/uL (ref 150–400)
RBC: 3.86 MIL/uL — AB (ref 3.87–5.11)
RDW: 13.5 % (ref 11.5–15.5)
WBC: 15.8 10*3/uL — ABNORMAL HIGH (ref 4.0–10.5)

## 2017-06-11 LAB — COMPREHENSIVE METABOLIC PANEL
ALT: 16 U/L (ref 14–54)
AST: 17 U/L (ref 15–41)
Albumin: 3 g/dL — ABNORMAL LOW (ref 3.5–5.0)
Alkaline Phosphatase: 144 U/L — ABNORMAL HIGH (ref 38–126)
Anion gap: 12 (ref 5–15)
BUN: 7 mg/dL (ref 6–20)
CO2: 21 mmol/L — ABNORMAL LOW (ref 22–32)
Calcium: 8.7 mg/dL — ABNORMAL LOW (ref 8.9–10.3)
Chloride: 106 mmol/L (ref 101–111)
Creatinine, Ser: 0.66 mg/dL (ref 0.44–1.00)
GFR calc Af Amer: >60 mL/min (ref >60)
GFR calc non Af Amer: >60 mL/min (ref >60)
Glucose, Bld: 95 mg/dL (ref 65–99)
Potassium: 3.5 mmol/L (ref 3.5–5.1)
Sodium: 139 mmol/L (ref 135–145)
Total Bilirubin: 0.4 mg/dL (ref 0.3–1.2)
Total Protein: 6.9 g/dL (ref 6.5–8.1)

## 2017-06-11 MED ORDER — PIPERACILLIN-TAZOBACTAM 3.375 G IVPB 30 MIN
3.3750 g | Freq: Once | INTRAVENOUS | Status: AC
  Administered 2017-06-11: 3.375 g via INTRAVENOUS
  Filled 2017-06-11: qty 50

## 2017-06-11 MED ORDER — SODIUM CHLORIDE 0.9 % IV BOLUS (SEPSIS)
1000.0000 mL | Freq: Once | INTRAVENOUS | Status: AC
  Administered 2017-06-11: 1000 mL via INTRAVENOUS

## 2017-06-11 MED ORDER — AMOXICILLIN-POT CLAVULANATE 875-125 MG PO TABS: 1.0000 | ORAL_TABLET | Freq: Two times a day (BID) | ORAL | 0 refills | Status: DC

## 2017-06-11 MED ORDER — KETOROLAC TROMETHAMINE 30 MG/ML IJ SOLN
30.0000 mg | Freq: Once | INTRAMUSCULAR | Status: AC
  Administered 2017-06-11: 30 mg via INTRAVENOUS
  Filled 2017-06-11: qty 1

## 2017-06-11 NOTE — ED Triage Notes (Signed)
Pt reports being 2 weeks postpartum. Had recent ultrasound and was told she needs gallbladder removed. Started on antibiotic on wed. Now has right facial swelling and diarrhea. Airway intact, denies swelling to her airway, also reports recent right side dental pain.

## 2017-06-11 NOTE — ED Notes (Signed)
ED Provider at bedside. 

## 2017-06-11 NOTE — ED Provider Notes (Signed)
[facility] EMERGENCY DEPARTMENT Provider Note   CSN: [id number] Arrival date & time: 06/11/17  1849     History   Chief Complaint Chief Complaint  Patient presents with  . Facial Swelling    HPI Theresa Hansen is a 22 y.o. female.  22 year old female presents to the emergency department for evaluation of facial swelling.  This has been worsening over the past 24 hours.  Symptoms associated with dentalgia and aggravated with jaw movement.  Patient denies any drainage in the mouth.  She has had intermittent fevers with maximum temperature of 100 F today.  She states that she is already on Keflex which she was prescribed by her primary care doctor for management of possible cholecystitis until outpatient surgical follow-up.  She states that her abdominal pain associated with this has greatly improved.  She has been taking ibuprofen intermittently as well as tramadol as needed.  No vomiting or diarrhea.  No urinary symptoms.  She has history of cesarean section on 05/30/2017.  She denies any problems with her surgical incision site.  She is not breast-feeding.      History reviewed. No pertinent past medical history.  Patient Active Problem List   Diagnosis Date Noted  . Pregnant 05/30/2017    Past Surgical History:  Procedure Laterality Date  . CESAREAN SECTION N/A 05/30/2017   Procedure: CESAREAN SECTION;  Surgeon: Ranae Pila, MD;  Location: [facility] BIRTHING [facility];  Service: Obstetrics;  Laterality: N/A;    OB History    Gravida Para Term Preterm AB Living   1 1 1     1    SAB TAB Ectopic Multiple Live Births         0 1       Home Medications    Prior to Admission medications   Medication Sig Start Date End Date Taking? Authorizing Provider  acetaminophen (TYLENOL) 325 MG tablet Take 650 mg by mouth every 6 (six) hours as needed for headache.   Yes [provider]  ibuprofen (ADVIL,MOTRIN) 600 MG tablet Take 1 tablet (600 mg total)  by mouth every 6 (six) hours as needed. 06/02/17  Yes Harold Hedge, MD  traMADol (ULTRAM) 50 MG tablet Take 1 tablet (50 mg total) by mouth every 6 (six) hours as needed. 06/02/17  Yes Harold Hedge, MD  amoxicillin-clavulanate (AUGMENTIN) 875-125 MG tablet Take 1 tablet by mouth every 12 (twelve) hours. 06/11/17   Antony Madura, PA-C    Family History Family History  Problem Relation Age of Onset  . Hyperlipidemia Mother   . Hyperlipidemia Father   . Heart disease Maternal Grandmother   . Seizures Maternal Grandmother   . Hypertension Maternal Grandmother   . Heart disease Maternal Grandfather   . Hypertension Maternal Grandfather   . Stroke Paternal Grandmother   . Diabetes Paternal Grandmother   . Thyroid disease Paternal Grandmother   . Hypertension Paternal Grandmother   . Hypertension Paternal Grandfather     Social History Social History   Tobacco Use  . Smoking status: Never Smoker  . Smokeless tobacco: Never Used  Substance Use Topics  . Alcohol use: No    Frequency: Never  . Drug use: No     Allergies   Codeine   Review of Systems Review of Systems Ten systems reviewed and are negative for acute change, except as noted in the HPI.    Physical Exam Updated Vital Signs BP 126/85   Pulse 75   Temp 99.6 F (37.6  C) (Oral)   Resp 16   SpO2 100%   Physical Exam  Constitutional: She is oriented to person, place, and time. She appears well-developed and well-nourished. No distress.  Nontoxic and in NAD  HENT:  Head: Normocephalic and atraumatic.  Nose:    Mouth/Throat: Oropharynx is clear and moist and mucous membranes are normal. No trismus in the jaw. Abnormal dentition. Dental caries present. No uvula swelling.  Gross dental decay and erosion. No gingival fluctuance. No trismus or drainage.  Eyes: Conjunctivae and EOM are normal. No scleral icterus.  Neck: Normal range of motion.  No meningismus  Cardiovascular: Normal rate, regular rhythm and  intact distal pulses.  Pulmonary/Chest: Effort normal. No stridor. No respiratory distress. She has no wheezes.  Abdominal: Soft. She exhibits no mass. There is no guarding.  C-section scar appears to be healing well.  No associated erythema.  No drainage or induration.  Minimal right upper quadrant tenderness.  Negative Murphy sign.  Abdomen soft, obese.  Musculoskeletal: Normal range of motion.  Neurological: She is alert and oriented to person, place, and time.  Skin: Skin is warm and dry. No rash noted. She is not diaphoretic. No erythema. No pallor.  Psychiatric: She has a normal mood and affect. Her behavior is normal.  Nursing note and vitals reviewed.    ED Treatments / Results  Labs (all labs ordered are listed, but only abnormal results are displayed) Labs Reviewed  CBC WITH DIFFERENTIAL/PLATELET - Abnormal; Notable for the following components:      Result Value   WBC 15.8 (*)    RBC 3.86 (*)    Hemoglobin 10.4 (*)    HCT 33.0 (*)    Neutro Abs 14.0 (*)    All other components within normal limits  COMPREHENSIVE METABOLIC PANEL - Abnormal; Notable for the following components:   CO2 21 (*)    Calcium 8.7 (*)    Albumin 3.0 (*)    Alkaline Phosphatase 144 (*)    All other components within normal limits    EKG  EKG Interpretation None       Radiology No results found.  Procedures Procedures (including critical care time)  Medications Ordered in ED Medications  sodium chloride 0.9 % bolus 1,000 mL (0 mLs Intravenous Stopped 06/11/17 2304)  ketorolac (TORADOL) 30 MG/ML injection 30 mg (30 mg Intravenous Given 06/11/17 2131)  piperacillin-tazobactam (ZOSYN) IVPB 3.375 g (0 g Intravenous Stopped 06/11/17 2201)    11:10 PM LFTs reassuring. Leukocytosis present and likely 2/2 resolving gallbladder etiology with worsening dental infection. No focal RUQ TTP or Murphy's sign on exam today. No abdominal peritoneal signs. Hgb improving since last visit to  PCP.   Initial Impression / Assessment and Plan / ED Course  I have reviewed the triage vital signs and the nursing notes.  Pertinent labs & imaging results that were available during my care of the patient were reviewed by me and considered in my medical decision making (see chart for details).     Patient with toothache.  No gross abscess, but infection likely given temperature of 100 F with facial swelling.  Exam unconcerning for Ludwig's angina or spread of infection.  Will treat with Augmentin and pain medicine.  Urged patient to follow-up with dentist.  Return precautions discussed and provided. Patient discharged in stable condition with no unaddressed concerns.   Final Clinical Impressions(s) / ED Diagnoses   Final diagnoses:  Dental infection    ED Discharge Orders  Ordered    amoxicillin-clavulanate (AUGMENTIN) 875-125 MG tablet  Every 12 hours     06/11/17 2310       Antony MaduraHumes, Sohum Delillo, Cordelia Poche-C 06/11/17 2328    Tegeler, Canary Brimhristopher J, MD 06/11/17 [phone]

## 2017-06-11 NOTE — Discharge Instructions (Signed)
Stop taking Keflex and, instead, take Augmentin as prescribed.  Call your dentist in the morning to schedule close follow-up.  We recommend that you continue with ibuprofen for fever or pain.  This may be supplemented with tramadol when needed.  Avoid eating fatty, fried, greasy foods as this will likely worsen your abdominal discomfort.  Continue with follow-up with general surgery as planned.  You may return to the emergency department for new or concerning symptoms.

## 2017-06-22 ENCOUNTER — Ambulatory Visit: Payer: Self-pay | Admitting: Surgery

## 2017-06-22 DIAGNOSIS — K819 Cholecystitis, unspecified: Secondary | ICD-10-CM | POA: Diagnosis not present

## 2017-06-22 NOTE — H&P (Signed)
Theresa Hansen. [name] Documented: 06/22/2017 10:12 AM Location: [facility] Patient #: [id number] DOB: December 01, 1995 Single / Language: [place] / Race: White Female   History of Present Illness (Theresa Hansen A. [name] MD; 06/22/2017 10:42 AM) Patient words: This is a very nice 22 year old woman who is about 3 weeks status post C-section referred for cholecystitis. Her C-section was on January 15 which was a Tuesday, and by the time that weekend arrived she was having severe right upper quadrant pain persisted with nausea vomiting fevers. She was seen by her primary physician on the 21st, she did undergo imaging shortly after this- Ultrasound Jan 24th showed non-mobile gallstones the largest measured 2.1 cm. All bladder wall thickened up to 4 mm and positive Murphy sign. Common bile duct was measured at 3 mm, and a small right pleural effusion was noted. She was started on Augmentin and pain medication with significant improvement in her symptoms. It is still a bit sore. She was to the emergency room on the 27th and was found to have a dental infection as well. Lab work on jan 27 shows mildly elevated alkaline phosphatase but otherwise unremarkable CMP, I count 15.8, hemoglobin 10, platelets 391. Her white count when she left the hospital after C-section was 26.7.  The patient is a 22 year old female.   Past Surgical History Theresa Hansen([name], CMA; 06/22/2017 10:12 AM) Cesarean Section - 1   Diagnostic Studies History Theresa Hansen([name], CMA; 06/22/2017 10:12 AM) Colonoscopy  never Mammogram  never Pap Smear  never  Allergies ([name], CMA; 06/22/2017 10:14 AM) Codeine and Related  Nausea, Vomiting.  Medication History Theresa Hansen([name], CMA; 06/22/2017 10:16 AM) Amoxicillin-Pot Clavulanate (875-125MG  Tablet, Oral) Active. Tylenol 8 Hour (650MG  Tablet ER, Oral) Active. Ibuprofen (600MG  Tablet, Oral) Active. TraMADol HCl (50MG  Tablet, Oral) Active. Protonix (20MG  Tablet DR, Oral)  Active. Medications Reconciled  Social History Theresa Hansen([name], CMA; 06/22/2017 10:12 AM) Caffeine use  Tea. No alcohol use  No drug use  Tobacco use  Never smoker.  Family History Theresa Hansen([name], CMA; 06/22/2017 10:12 AM) Arthritis  Mother. Heart disease in female family member before age 21  Heart disease in female family member before age 21  Hypertension  Father.  Pregnancy / Birth History Theresa Hansen([name], CMA; 06/22/2017 10:12 AM) Age at menarche  13 years. Gravida  1 Maternal age  21-20 Para  1 Regular periods   Other Problems Theresa Hansen([name], CMA; 06/22/2017 10:12 AM) Gastroesophageal Reflux Disease     Review of Systems ([name] CMA; 06/22/2017 10:12 AM) General Present- Appetite Loss, Fatigue and Fever. Not Present- Chills, Night Sweats, Weight Gain and Weight Loss. Skin Not Present- Change in Wart/Mole, Dryness, Hives, Jaundice, New Lesions, Non-Healing Wounds, Rash and Ulcer. HEENT Not Present- Earache, Hearing Loss, Hoarseness, Nose Bleed, Oral Ulcers, Ringing in the Ears, Seasonal Allergies, Sinus Pain, Sore Throat, Visual Disturbances, Wears glasses/contact lenses and Yellow Eyes. Respiratory Not Present- Bloody sputum, Chronic Cough, Difficulty Breathing, Snoring and Wheezing. Breast Not Present- Breast Mass, Breast Pain, Nipple Discharge and Skin Changes. Cardiovascular Not Present- Chest Pain, Difficulty Breathing Lying Down, Leg Cramps, Palpitations, Rapid Heart Rate, Shortness of Breath and Swelling of Extremities. Gastrointestinal Present- Abdominal Pain, Nausea and Vomiting. Not Present- Bloating, Bloody Stool, Change in Bowel Habits, Chronic diarrhea, Constipation, Difficulty Swallowing, Excessive gas, Gets full quickly at meals, Hemorrhoids, Indigestion and Rectal Pain. Female Genitourinary Not Present- Frequency, Nocturia, Painful Urination, Pelvic Pain and Urgency. Musculoskeletal Not Present- Back Pain, Joint Pain, Joint Stiffness, Muscle Pain, Muscle  Weakness and Swelling of Extremities. Neurological Not Present- Decreased Memory, Fainting, Headaches, Numbness, Seizures, Tingling, Tremor, Trouble walking and Weakness. Psychiatric Not Present- Anxiety, Bipolar, Change in Sleep Pattern, Depression, Fearful and Frequent crying. Endocrine Not Present- Cold Intolerance, Excessive Hunger, Hair Changes, Heat Intolerance, Hot flashes and New Diabetes. Hematology Not Present- Blood Thinners, Easy Bruising, Excessive bleeding, Gland problems, HIV and Persistent Infections.  Vitals ([name] CMA; 06/22/2017 10:13 AM) 06/22/2017 10:13 AM Weight: 163.13 lb Height: 64in Body Surface Area: 1.79 m Body Mass Index: 28 kg/m  Temp.: 98.41F  Pulse: 112 (Regular)  P.OX: 99% (Room air) BP: 122/88 (Sitting, Left Arm, Standard)       Physical Exam (Sanel Stemmer A. Fredricka Bonine MD; 06/22/2017 10:44 AM) General Note: alert and well appearing   Integumentary Note: warm and dry   Head and Neck Note: no mass or thyromegaly   Eye Note: No scleral icterus. Extra ocular motions intact.   ENMT Note: Moist mucous membranes, poor dentition   Chest and Lung Exam Note: Unlabored respirations, clear bilaterally   Cardiovascular Note: Regular rate and rhythm, no pedal edema   Abdomen Note: Soft, nondistended. Diffusely mildly tender, more so along the right subcostal margin. C-section incision is healing well. No mass or organomegaly.   Neurologic Note: Grossly intact, normal gait   Neuropsychiatric Note: Normal mood and affect. Appropriate insight.   Musculoskeletal Note: Strength symmetrical throughout, no deformity     Assessment & Plan (Kiko Ripp A. Sabrena Gavitt MD; 06/22/2017 10:46 AM) CHOLECYSTITIS (K81.9) Story: She had cholecystitis on ultrasound as well as consistent symptoms 3 weeks ago. She's had some improvement with pain medicine and antibiotics but she is still quite sore. I recommend laparoscopic cholecystectomy. We  discussed the nature of that surgery, the risks of bleeding, infection, pain, scarring, intra-abdominal injury specifically to the common bile duct and sequelae, risk of bile leak, incisional hernia, etc. she is not breast-feeding. questions were welcomed and answered. We will get her scheduled as soon as possible.  Signed electronically by Berna Bue, MD (06/22/2017 10:46 AM)

## 2017-06-22 NOTE — H&P (View-Only) (Signed)
Theresa Hansen. Theresa Hansen Documented: 06/22/2017 10:12 AM Location: [facility] Patient #: [id number] DOB: December 01, 1995 Single / Language: [place] / Race: White Female   History of Present Illness ([name] A. [name] MD; 06/22/2017 10:42 AM) Patient words: This is a very nice 22 year old woman who is about 3 weeks status post C-section referred for cholecystitis. Her C-section was on January 15 which was a Tuesday, and by the time that weekend arrived she was having severe right upper quadrant pain persisted with nausea vomiting fevers. She was seen by her primary physician on the 21st, she did undergo imaging shortly after this- Ultrasound Jan 24th showed non-mobile gallstones the largest measured 2.1 cm. All bladder wall thickened up to 4 mm and positive Murphy sign. Common bile duct was measured at 3 mm, and a small right pleural effusion was noted. She was started on Augmentin and pain medication with significant improvement in her symptoms. It is still a bit sore. She was to the emergency room on the 27th and was found to have a dental infection as well. Lab work on jan 27 shows mildly elevated alkaline phosphatase but otherwise unremarkable CMP, I count 15.8, hemoglobin 10, platelets 391. Her white count when she left the hospital after C-section was 26.7.  The patient is a 22 year old female.   Past Surgical History Theresa Hansen([name], CMA; 06/22/2017 10:12 AM) Cesarean Section - 1   Diagnostic Studies History Theresa Hansen([name], CMA; 06/22/2017 10:12 AM) Colonoscopy  never Mammogram  never Pap Smear  never  Allergies ([name], CMA; 06/22/2017 10:14 AM) Codeine and Related  Nausea, Vomiting.  Medication History Theresa Hansen([name], CMA; 06/22/2017 10:16 AM) Amoxicillin-Pot Clavulanate (875-125MG  Tablet, Oral) Active. Tylenol 8 Hour (650MG  Tablet ER, Oral) Active. Ibuprofen (600MG  Tablet, Oral) Active. TraMADol HCl (50MG  Tablet, Oral) Active. Protonix (20MG  Tablet DR, Oral)  Active. Medications Reconciled  Social History Theresa Hansen([name], CMA; 06/22/2017 10:12 AM) Caffeine use  Tea. No alcohol use  No drug use  Tobacco use  Never smoker.  Family History Theresa Hansen([name], CMA; 06/22/2017 10:12 AM) Arthritis  Mother. Heart disease in female family member before age 21  Heart disease in female family member before age 21  Hypertension  Father.  Pregnancy / Birth History Theresa Hansen([name], CMA; 06/22/2017 10:12 AM) Age at menarche  13 years. Gravida  1 Maternal age  21-20 Para  1 Regular periods   Other Problems Theresa Hansen([name], CMA; 06/22/2017 10:12 AM) Gastroesophageal Reflux Disease     Review of Systems ([name] CMA; 06/22/2017 10:12 AM) General Present- Appetite Loss, Fatigue and Fever. Not Present- Chills, Night Sweats, Weight Gain and Weight Loss. Skin Not Present- Change in Wart/Mole, Dryness, Hives, Jaundice, New Lesions, Non-Healing Wounds, Rash and Ulcer. HEENT Not Present- Earache, Hearing Loss, Hoarseness, Nose Bleed, Oral Ulcers, Ringing in the Ears, Seasonal Allergies, Sinus Pain, Sore Throat, Visual Disturbances, Wears glasses/contact lenses and Yellow Eyes. Respiratory Not Present- Bloody sputum, Chronic Cough, Difficulty Breathing, Snoring and Wheezing. Breast Not Present- Breast Mass, Breast Pain, Nipple Discharge and Skin Changes. Cardiovascular Not Present- Chest Pain, Difficulty Breathing Lying Down, Leg Cramps, Palpitations, Rapid Heart Rate, Shortness of Breath and Swelling of Extremities. Gastrointestinal Present- Abdominal Pain, Nausea and Vomiting. Not Present- Bloating, Bloody Stool, Change in Bowel Habits, Chronic diarrhea, Constipation, Difficulty Swallowing, Excessive gas, Gets full quickly at meals, Hemorrhoids, Indigestion and Rectal Pain. Female Genitourinary Not Present- Frequency, Nocturia, Painful Urination, Pelvic Pain and Urgency. Musculoskeletal Not Present- Back Pain, Joint Pain, Joint Stiffness, Muscle Pain, Muscle  Weakness and Swelling of Extremities. Neurological Not Present- Decreased Memory, Fainting, Headaches, Numbness, Seizures, Tingling, Tremor, Trouble walking and Weakness. Psychiatric Not Present- Anxiety, Bipolar, Change in Sleep Pattern, Depression, Fearful and Frequent crying. Endocrine Not Present- Cold Intolerance, Excessive Hunger, Hair Changes, Heat Intolerance, Hot flashes and New Diabetes. Hematology Not Present- Blood Thinners, Easy Bruising, Excessive bleeding, Gland problems, HIV and Persistent Infections.  Vitals ([name] CMA; 06/22/2017 10:13 AM) 06/22/2017 10:13 AM Weight: 163.13 lb Height: 64in Body Surface Area: 1.79 m Body Mass Index: 28 kg/m  Temp.: 98.41F  Pulse: 112 (Regular)  P.OX: 99% (Room air) BP: 122/88 (Sitting, Left Arm, Standard)       Physical Exam ([name] A. Fredricka Bonine MD; 06/22/2017 10:44 AM) General Note: alert and well appearing   Integumentary Note: warm and dry   Head and Neck Note: no mass or thyromegaly   Eye Note: No scleral icterus. Extra ocular motions intact.   ENMT Note: Moist mucous membranes, poor dentition   Chest and Lung Exam Note: Unlabored respirations, clear bilaterally   Cardiovascular Note: Regular rate and rhythm, no pedal edema   Abdomen Note: Soft, nondistended. Diffusely mildly tender, more so along the right subcostal margin. C-section incision is healing well. No mass or organomegaly.   Neurologic Note: Grossly intact, normal gait   Neuropsychiatric Note: Normal mood and affect. Appropriate insight.   Musculoskeletal Note: Strength symmetrical throughout, no deformity     Assessment & Plan ([name] A. [name] MD; 06/22/2017 10:46 AM) CHOLECYSTITIS (K81.9) Story: She had cholecystitis on ultrasound as well as consistent symptoms 3 weeks ago. She's had some improvement with pain medicine and antibiotics but she is still quite sore. I recommend laparoscopic cholecystectomy. We  discussed the nature of that surgery, the risks of bleeding, infection, pain, scarring, intra-abdominal injury specifically to the common bile duct and sequelae, risk of bile leak, incisional hernia, etc. she is not breast-feeding. questions were welcomed and answered. We will get her scheduled as soon as possible.  Signed electronically by Berna Bue, MD (06/22/2017 10:46 AM)

## 2017-06-23 ENCOUNTER — Other Ambulatory Visit: Payer: Self-pay

## 2017-06-23 ENCOUNTER — Encounter (HOSPITAL_COMMUNITY): Payer: Self-pay | Admitting: *Deleted

## 2017-06-26 ENCOUNTER — Ambulatory Visit (HOSPITAL_COMMUNITY)
Admission: RE | Admit: 2017-06-26 | Discharge: 2017-06-26 | Disposition: A | Discharge status: Home / Self Care | Payer: BLUE CROSS/BLUE SHIELD | Source: Ambulatory Visit | Attending: Surgery | Admitting: Surgery

## 2017-06-26 ENCOUNTER — Encounter (HOSPITAL_COMMUNITY): Payer: Self-pay | Admitting: *Deleted

## 2017-06-26 ENCOUNTER — Encounter (HOSPITAL_COMMUNITY)
Admission: RE | Disposition: A | Discharge status: Home / Self Care | Payer: Self-pay | Source: Ambulatory Visit | Attending: Surgery

## 2017-06-26 ENCOUNTER — Ambulatory Visit (HOSPITAL_COMMUNITY): Payer: BLUE CROSS/BLUE SHIELD | Admitting: Certified Registered Nurse Anesthetist

## 2017-06-26 DIAGNOSIS — Z791 Long term (current) use of non-steroidal anti-inflammatories (NSAID): Secondary | ICD-10-CM | POA: Insufficient documentation

## 2017-06-26 DIAGNOSIS — K660 Peritoneal adhesions (postprocedural) (postinfection): Secondary | ICD-10-CM | POA: Insufficient documentation

## 2017-06-26 DIAGNOSIS — K819 Cholecystitis, unspecified: Secondary | ICD-10-CM | POA: Diagnosis not present

## 2017-06-26 DIAGNOSIS — K8000 Calculus of gallbladder with acute cholecystitis without obstruction: Secondary | ICD-10-CM | POA: Diagnosis not present

## 2017-06-26 DIAGNOSIS — Z79899 Other long term (current) drug therapy: Secondary | ICD-10-CM | POA: Insufficient documentation

## 2017-06-26 DIAGNOSIS — K219 Gastro-esophageal reflux disease without esophagitis: Secondary | ICD-10-CM | POA: Diagnosis not present

## 2017-06-26 HISTORY — PX: CHOLECYSTECTOMY: SHX55

## 2017-06-26 HISTORY — DX: Gastro-esophageal reflux disease without esophagitis: K21.9

## 2017-06-26 LAB — HCG, SERUM, QUALITATIVE: PREG SERUM: NEGATIVE

## 2017-06-26 SURGERY — LAPAROSCOPIC CHOLECYSTECTOMY
Anesthesia: General

## 2017-06-26 MED ORDER — ONDANSETRON HCL 4 MG/2ML IJ SOLN
INTRAMUSCULAR | Status: AC
  Filled 2017-06-26: qty 2

## 2017-06-26 MED ORDER — BUPIVACAINE-EPINEPHRINE 0.25% -1:200000 IJ SOLN
INTRAMUSCULAR | Status: AC
  Filled 2017-06-26: qty 1

## 2017-06-26 MED ORDER — FENTANYL CITRATE (PF) 250 MCG/5ML IJ SOLN
INTRAMUSCULAR | Status: AC
  Filled 2017-06-26: qty 5

## 2017-06-26 MED ORDER — KETOROLAC TROMETHAMINE 30 MG/ML IJ SOLN
30.0000 mg | Freq: Once | INTRAMUSCULAR | Status: DC | PRN
  Administered 2017-06-26 (×2): 30 mg via INTRAVENOUS

## 2017-06-26 MED ORDER — LIDOCAINE 2% (20 MG/ML) 5 ML SYRINGE
INTRAMUSCULAR | Status: AC
  Filled 2017-06-26: qty 5

## 2017-06-26 MED ORDER — CELECOXIB 200 MG PO CAPS
200.0000 mg | ORAL_CAPSULE | ORAL | Status: AC
  Administered 2017-06-26: 200 mg via ORAL
  Filled 2017-06-26: qty 1

## 2017-06-26 MED ORDER — OXYCODONE HCL 5 MG PO TABS: 5.0000 mg | ORAL_TABLET | ORAL | Status: DC | PRN

## 2017-06-26 MED ORDER — DEXAMETHASONE SODIUM PHOSPHATE 10 MG/ML IJ SOLN
INTRAMUSCULAR | Status: AC
  Filled 2017-06-26: qty 1

## 2017-06-26 MED ORDER — ACETAMINOPHEN 500 MG PO TABS
1000.0000 mg | ORAL_TABLET | ORAL | Status: AC
  Administered 2017-06-26: 1000 mg via ORAL
  Filled 2017-06-26: qty 2

## 2017-06-26 MED ORDER — DOCUSATE SODIUM 100 MG PO CAPS: 100.0000 mg | ORAL_CAPSULE | Freq: Two times a day (BID) | ORAL | 0 refills | Status: AC

## 2017-06-26 MED ORDER — ONDANSETRON HCL 4 MG/2ML IJ SOLN
INTRAMUSCULAR | Status: DC | PRN
  Administered 2017-06-26: 4 mg via INTRAVENOUS

## 2017-06-26 MED ORDER — HYDROMORPHONE HCL 1 MG/ML IJ SOLN
INTRAMUSCULAR | Status: AC
  Administered 2017-06-26: 0.5 mg via INTRAVENOUS
  Filled 2017-06-26: qty 1

## 2017-06-26 MED ORDER — SCOPOLAMINE 1 MG/3DAYS TD PT72
MEDICATED_PATCH | TRANSDERMAL | Status: AC
  Filled 2017-06-26: qty 1

## 2017-06-26 MED ORDER — 0.9 % SODIUM CHLORIDE (POUR BTL) OPTIME
TOPICAL | Status: DC | PRN
  Administered 2017-06-26: 1000 mL

## 2017-06-26 MED ORDER — LIDOCAINE 2% (20 MG/ML) 5 ML SYRINGE
INTRAMUSCULAR | Status: DC | PRN
  Administered 2017-06-26: 60 mg via INTRAVENOUS

## 2017-06-26 MED ORDER — FENTANYL CITRATE (PF) 100 MCG/2ML IJ SOLN
INTRAMUSCULAR | Status: AC
  Filled 2017-06-26: qty 2

## 2017-06-26 MED ORDER — HYDROMORPHONE HCL 1 MG/ML IJ SOLN
0.2500 mg | INTRAMUSCULAR | Status: DC | PRN
  Administered 2017-06-26 (×4): 0.5 mg via INTRAVENOUS

## 2017-06-26 MED ORDER — MIDAZOLAM HCL 5 MG/5ML IJ SOLN
INTRAMUSCULAR | Status: DC | PRN
  Administered 2017-06-26 (×2): 1 mg via INTRAVENOUS

## 2017-06-26 MED ORDER — PROMETHAZINE HCL 25 MG/ML IJ SOLN: 6.2500 mg | INTRAMUSCULAR | Status: DC | PRN

## 2017-06-26 MED ORDER — FENTANYL CITRATE (PF) 100 MCG/2ML IJ SOLN: 25.0000 ug | INTRAMUSCULAR | Status: DC | PRN

## 2017-06-26 MED ORDER — OXYCODONE-ACETAMINOPHEN 5-325 MG PO TABS: 1.0000 | ORAL_TABLET | Freq: Four times a day (QID) | ORAL | 0 refills | Status: AC | PRN

## 2017-06-26 MED ORDER — SUCCINYLCHOLINE CHLORIDE 200 MG/10ML IV SOSY
PREFILLED_SYRINGE | INTRAVENOUS | Status: DC | PRN
  Administered 2017-06-26: 100 mg via INTRAVENOUS

## 2017-06-26 MED ORDER — SODIUM CHLORIDE 0.9 % IV SOLN: 250.0000 mL | INTRAVENOUS | Status: DC | PRN

## 2017-06-26 MED ORDER — PROPOFOL 10 MG/ML IV BOLUS
INTRAVENOUS | Status: DC | PRN
  Administered 2017-06-26: 150 mg via INTRAVENOUS

## 2017-06-26 MED ORDER — MIDAZOLAM HCL 2 MG/2ML IJ SOLN
INTRAMUSCULAR | Status: AC
  Filled 2017-06-26: qty 2

## 2017-06-26 MED ORDER — LACTATED RINGERS IV SOLN
INTRAVENOUS | Status: DC
  Administered 2017-06-26 (×2): via INTRAVENOUS

## 2017-06-26 MED ORDER — ACETAMINOPHEN 650 MG RE SUPP
650.0000 mg | RECTAL | Status: DC | PRN
  Filled 2017-06-26: qty 1

## 2017-06-26 MED ORDER — CHLORHEXIDINE GLUCONATE 4 % EX LIQD: 60.0000 mL | Freq: Once | CUTANEOUS | Status: DC

## 2017-06-26 MED ORDER — DEXAMETHASONE SODIUM PHOSPHATE 10 MG/ML IJ SOLN
INTRAMUSCULAR | Status: DC | PRN
  Administered 2017-06-26: 10 mg via INTRAVENOUS

## 2017-06-26 MED ORDER — LACTATED RINGERS IV SOLN
INTRAVENOUS | Status: AC | PRN
  Administered 2017-06-26: 1000 mL

## 2017-06-26 MED ORDER — ROCURONIUM BROMIDE 50 MG/5ML IV SOSY
PREFILLED_SYRINGE | INTRAVENOUS | Status: DC | PRN
  Administered 2017-06-26: 10 mg via INTRAVENOUS
  Administered 2017-06-26: 40 mg via INTRAVENOUS
  Administered 2017-06-26: 10 mg via INTRAVENOUS

## 2017-06-26 MED ORDER — GABAPENTIN 300 MG PO CAPS
300.0000 mg | ORAL_CAPSULE | ORAL | Status: AC
  Administered 2017-06-26: 300 mg via ORAL
  Filled 2017-06-26: qty 1

## 2017-06-26 MED ORDER — SODIUM CHLORIDE 0.9% FLUSH: 3.0000 mL | INTRAVENOUS | Status: DC | PRN

## 2017-06-26 MED ORDER — SCOPOLAMINE 1 MG/3DAYS TD PT72
MEDICATED_PATCH | TRANSDERMAL | Status: DC | PRN
  Administered 2017-06-26: 1 via TRANSDERMAL

## 2017-06-26 MED ORDER — BUPIVACAINE-EPINEPHRINE 0.25% -1:200000 IJ SOLN
INTRAMUSCULAR | Status: DC | PRN
  Administered 2017-06-26: 37 mL

## 2017-06-26 MED ORDER — PROPOFOL 10 MG/ML IV BOLUS
INTRAVENOUS | Status: AC
  Filled 2017-06-26: qty 20

## 2017-06-26 MED ORDER — SODIUM CHLORIDE 0.9% FLUSH: 3.0000 mL | Freq: Two times a day (BID) | INTRAVENOUS | Status: DC

## 2017-06-26 MED ORDER — FENTANYL CITRATE (PF) 100 MCG/2ML IJ SOLN
INTRAMUSCULAR | Status: DC | PRN
  Administered 2017-06-26 (×7): 50 ug via INTRAVENOUS

## 2017-06-26 MED ORDER — PROMETHAZINE HCL 25 MG/ML IJ SOLN
INTRAMUSCULAR | Status: AC
  Filled 2017-06-26: qty 1

## 2017-06-26 MED ORDER — ROCURONIUM BROMIDE 10 MG/ML (PF) SYRINGE
PREFILLED_SYRINGE | INTRAVENOUS | Status: AC
  Filled 2017-06-26: qty 5

## 2017-06-26 MED ORDER — ACETAMINOPHEN 325 MG PO TABS: 650.0000 mg | ORAL_TABLET | ORAL | Status: DC | PRN

## 2017-06-26 MED ORDER — KETOROLAC TROMETHAMINE 30 MG/ML IJ SOLN
INTRAMUSCULAR | Status: AC
  Administered 2017-06-26: 30 mg via INTRAVENOUS
  Filled 2017-06-26: qty 1

## 2017-06-26 MED ORDER — CEFAZOLIN SODIUM-DEXTROSE 2-4 GM/100ML-% IV SOLN
2.0000 g | INTRAVENOUS | Status: AC
  Administered 2017-06-26: 2 g via INTRAVENOUS
  Filled 2017-06-26: qty 100

## 2017-06-26 MED ORDER — SUGAMMADEX SODIUM 200 MG/2ML IV SOLN
INTRAVENOUS | Status: AC
  Filled 2017-06-26: qty 2

## 2017-06-26 MED ORDER — SUCCINYLCHOLINE CHLORIDE 200 MG/10ML IV SOSY
PREFILLED_SYRINGE | INTRAVENOUS | Status: AC
  Filled 2017-06-26: qty 10

## 2017-06-26 SURGICAL SUPPLY — 35 items
ADH SKN CLS APL DERMABOND .7 (GAUZE/BANDAGES/DRESSINGS) ×1
APPLIER CLIP ROT 10 11.4 M/L (STAPLE) ×2
APR CLP MED LRG 11.4X10 (STAPLE) ×1
BAG SPEC RTRVL LRG 6X4 10 (ENDOMECHANICALS) ×1
CABLE HIGH FREQUENCY MONO STRZ (ELECTRODE) ×2 IMPLANT
CHLORAPREP W/TINT 26ML (MISCELLANEOUS) ×2 IMPLANT
CLIP APPLIE ROT 10 11.4 M/L (STAPLE) ×1 IMPLANT
COVER MAYO STAND STRL (DRAPES) IMPLANT
COVER SURGICAL LIGHT HANDLE (MISCELLANEOUS) ×2 IMPLANT
DECANTER SPIKE VIAL GLASS SM (MISCELLANEOUS) ×2 IMPLANT
DERMABOND ADVANCED (GAUZE/BANDAGES/DRESSINGS) ×1
DERMABOND ADVANCED .7 DNX12 (GAUZE/BANDAGES/DRESSINGS) ×1 IMPLANT
DRAPE C-ARM 42X120 X-RAY (DRAPES) IMPLANT
ELECT REM PT RETURN 15FT ADLT (MISCELLANEOUS) ×2 IMPLANT
GLOVE BIO SURGEON STRL SZ 6 (GLOVE) ×2 IMPLANT
GLOVE INDICATOR 6.5 STRL GRN (GLOVE) ×2 IMPLANT
GOWN STRL REUS W/TWL LRG LVL3 (GOWN DISPOSABLE) ×2 IMPLANT
GOWN STRL REUS W/TWL XL LVL3 (GOWN DISPOSABLE) ×4 IMPLANT
GRASPER SUT TROCAR 14GX15 (MISCELLANEOUS) ×2 IMPLANT
HEMOSTAT SNOW SURGICEL 2X4 (HEMOSTASIS) IMPLANT
KIT BASIN OR (CUSTOM PROCEDURE TRAY) ×2 IMPLANT
NDL INSUFFLATION 14GA 120MM (NEEDLE) ×1 IMPLANT
NEEDLE INSUFFLATION 14GA 120MM (NEEDLE) ×2 IMPLANT
POUCH SPECIMEN RETRIEVAL 10MM (ENDOMECHANICALS) ×2 IMPLANT
SCISSORS LAP 5X35 DISP (ENDOMECHANICALS) ×2 IMPLANT
SET CHOLANGIOGRAPH MIX (MISCELLANEOUS) IMPLANT
SET IRRIG TUBING LAPAROSCOPIC (IRRIGATION / IRRIGATOR) ×2 IMPLANT
SLEEVE XCEL OPT CAN 5 100 (ENDOMECHANICALS) ×4 IMPLANT
SUT MNCRL AB 4-0 PS2 18 (SUTURE) ×2 IMPLANT
TOWEL OR 17X26 10 PK STRL BLUE (TOWEL DISPOSABLE) ×2 IMPLANT
TOWEL OR NON WOVEN STRL DISP B (DISPOSABLE) IMPLANT
TRAY LAPAROSCOPIC (CUSTOM PROCEDURE TRAY) ×2 IMPLANT
TROCAR BLADELESS OPT 5 100 (ENDOMECHANICALS) ×2 IMPLANT
TROCAR XCEL 12X100 BLDLESS (ENDOMECHANICALS) ×2 IMPLANT
TUBING INSUF HEATED (TUBING) ×2 IMPLANT

## 2017-06-26 NOTE — Op Note (Addendum)
Operative Note  Theresa Hansen 22 y.o. female [id number]  06/26/2017  Surgeon: Berna Buehelsea A Connor MD  Assistant: Feliciana RossettiLuke Kinsinger MD  Procedure performed: Laparoscopic Cholecystectomy  Preop diagnosis: 3 weeks of cholecystitis Post-op diagnosis/intraop findings: same  Specimens: gallbladder  EBL: minimal  Complications: none  Description of procedure: After obtaining informed consent the patient was brought to the operating room. Prophylactic antibiotics and subcutaneous heparin were administered. SCD's were applied. General endotracheal anesthesia was initiated and a formal time-out was performed. The abdomen was prepped and draped in the usual sterile fashion and the abdomen was entered using a supra-umbilical veress needle after instilling the site with local. Insufflation to 15mmHg was obtained, 5mm trocar and camera introduced and gross inspection revealed no evidence of injury from our entry. She had a significant amount of adhesions between the omentum and the entire length of the right sided anterior abdominal wall as well as between the dome of the liver and the diaphragm/ abdominal wall. Some of these were taken down to allow for trocar placement. Two 5mm trocars were introduced in the right midclavicular and right anterior axillary lines under direct visualization and following infiltration with local. An 11mm trocar was placed in the epigastrium to the right of the patient's falciform. The gallbladder was tensely distended, very inflamed and encased in very thickened adhesions of peritoneum. The nezhat was used to aspirate turbid light green fluid from the gallbladder which could then be grasped more easily. The gallbladder was retracted cephalad and the infundibulum was retracted laterally. Gentle blunt dissection with the suction tip was used to peel away the thickened rind of peritoneum which had encased the gallbladder nearly circumferentially at the neck and infundibulum. Cautery  was also used (sparingly given concern for proximity of duodenum) where possible to lift the gallbladder from the cystic plate and divide the tougher adhesive bands. This was a very tedious process and an Designer, television/film setassistant surgeon was necessary for appropriate exposure and traction. Ultimately what she had was a very large stone impacted at the opening of the cystic duct in this area of the gallbladder had become very adherent to surrounding structures. Ultimately we are able to free this area and visualized the cystic duct which was quite diminutive. A combination of hook electrocautery and blunt dissection was utilized to clear the peritoneum from the neck and cystic duct, circumferentially isolating the cystic artery and cystic duct and lifting the gallbladder from the cystic plate. The critical view of safety was achieved with the cystic artery, cystic duct, and liver bed visualized between them with no other structures. The artery was clipped with a single clip proximally and distally and divided as was the cystic duct with three clips on the proximal end. The gallbladder was dissected from the liver plate using electrocautery. Once freed the gallbladder was placed in an endocatch bag and removed intact through the epigastric trocar site. A small amount of bleeding on the liver bed was controlled with cautery. The right upper quadrant was irrigated and the effluent was clear. Hemostasis was once again confirmed, and reinspection of the abdomen revealed no injuries. There was no evidence of injury to the duodenum or other structures near the porta on close inspection. The clips were well opposed without any bile leak from the duct or the liver bed. The 11mm trocar site in the epigastrium was closed with 2 interrupted 0 vicryls in the fascia under direct visualization using a PMI device. The abdomen was desufflated and all trocars removed. The skin  incisions were closed with running subcuticular monocryl and Dermabond.  The patient was awakened, extubated and transported to the recovery room in stable condition.   All counts were correct at the completion of the case.

## 2017-06-26 NOTE — Anesthesia Preprocedure Evaluation (Signed)
Anesthesia Evaluation  Patient identified by MRN, date of birth, ID band Patient awake    Reviewed: Allergy & Precautions, NPO status , Patient's Chart, lab work & pertinent test results  Airway Mallampati: II  TM Distance: >3 FB Neck ROM: Full    Dental no notable dental hx.    Pulmonary neg pulmonary ROS,    Pulmonary exam normal breath sounds clear to auscultation       Cardiovascular negative cardio ROS Normal cardiovascular exam Rhythm:Regular Rate:Normal     Neuro/Psych negative neurological ROS  negative psych ROS   GI/Hepatic Neg liver ROS, GERD  ,  Endo/Other  negative endocrine ROS  Renal/GU negative Renal ROS  negative genitourinary   Musculoskeletal negative musculoskeletal ROS (+)   Abdominal   Peds negative pediatric ROS (+)  Hematology negative hematology ROS (+)   Anesthesia Other Findings   Reproductive/Obstetrics negative OB ROS                             Anesthesia Physical Anesthesia Plan  ASA: II  Anesthesia Plan: General   Post-op Pain Management:    Induction: Intravenous  PONV Risk Score and Plan: 3 and Ondansetron, Dexamethasone, Midazolam and Treatment may vary due to age or medical condition  Airway Management Planned: Oral ETT  Additional Equipment:   Intra-op Plan:   Post-operative Plan: Extubation in OR  Informed Consent: I have reviewed the patients History and Physical, chart, labs and discussed the procedure including the risks, benefits and alternatives for the proposed anesthesia with the patient or authorized representative who has indicated his/her understanding and acceptance.     Dental advisory given  Plan Discussed with: CRNA and Surgeon  Anesthesia Plan Comments:         Anesthesia Quick Evaluation  

## 2017-06-26 NOTE — Discharge Instructions (Signed)
LAPAROSCOPIC SURGERY: POST OP INSTRUCTIONS  ######################################################################  EAT Gradually transition to a high fiber diet with a fiber supplement over the next few weeks after discharge.  Start with a pureed / full liquid diet (see below)  WALK Walk an hour a day.  Control your pain to do that.    CONTROL PAIN Control pain so that you can walk, sleep, tolerate sneezing/coughing, go up/down stairs.  HAVE A BOWEL MOVEMENT DAILY Keep your bowels regular to avoid problems.  OK to try a laxative to override constipation.  OK to use an antidairrheal to slow down diarrhea.  Call if not better after 2 tries  CALL IF YOU HAVE PROBLEMS/CONCERNS Call if you are still struggling despite following these instructions. Call if you have concerns not answered by these instructions  ######################################################################    1. DIET: Follow a light bland diet the first 24 hours after arrival home, such as soup, liquids, crackers, etc.  Be sure to include lots of fluids daily.  Avoid fast food or heavy meals as your are more likely to get nauseated.  Eat a low fat the next few days after surgery.   2. Take your usually prescribed home medications unless otherwise directed. 3. PAIN CONTROL: a. Pain is best controlled by a usual combination of three different methods TOGETHER: i. Ice/Heat ii. Over the counter pain medication iii. Prescription pain medication b. Most patients will experience some swelling and bruising around the incisions.  Ice packs or heating pads (30-60 minutes up to 6 times a day) will help. Use ice for the first few days to help decrease swelling and bruising, then switch to heat to help relax tight/sore spots and speed recovery.  Some people prefer to use ice alone, heat alone, alternating between ice & heat.  Experiment to what works for you.  Swelling and bruising can take several weeks to resolve.   c. It is  helpful to take an over-the-counter pain medication regularly for the first few weeks.  Choose one of the following that works best for you: i. Naproxen (Aleve, etc)  Two 251m tabs twice a day ii. Ibuprofen (Advil, etc) Three 2053mtabs four times a day (every meal & bedtime) iii. Acetaminophen (Tylenol, etc) 500-65044mour times a day (every meal & bedtime) d. A  prescription for pain medication (such as oxycodone, hydrocodone, etc) should be given to you upon discharge.  Take your pain medication as prescribed.  i. If you are having problems/concerns with the prescription medicine (does not control pain, nausea, vomiting, rash, itching, etc), please call us [phone] see if we need to switch you to a different pain medicine that will work better for you and/or control your side effect better. ii. If you need a refill on your pain medication, please contact your pharmacy.  They will contact our office to request authorization. Prescriptions will not be filled after 5 pm or on week-ends. 4. Avoid getting constipated.  Between the surgery and the pain medications, it is common to experience some constipation.  Increasing fluid intake and taking a fiber supplement (such as Metamucil, Citrucel, FiberCon, MiraLax, etc) 1-2 times a day regularly will usually help prevent this problem from occurring.  A mild laxative (prune juice, Milk of Magnesia, MiraLax, etc) should be taken according to package directions if there are no bowel movements after 48 hours.   5. Watch out for diarrhea.  If you have many loose bowel movements, simplify your diet to bland foods & liquids for  a few days.  Stop any stool softeners and decrease your fiber supplement.  Switching to mild anti-diarrheal medications (Kayopectate, Pepto Bismol) can help.  If this worsens or does not improve, please call us. 6. Wash / shower every day.  You may shower over the skin glue which is waterproof.  Continue to shower over incision(s) after  the dressing is off. No rubbing, scrubbing, lotions or ointments to incisions until after your follow up visit. 7. Skin glue will flake off after about 2 weeks.  You may leave the incision open to air.  You may replace a dressing/Band-Aid to cover the incision for comfort if you wish.  8. ACTIVITIES as tolerated:   a. You may resume regular (light) daily activities beginning the next day--such as daily self-care, walking, climbing stairs--gradually increasing activities as tolerated.  If you can walk 30 minutes without difficulty, it is safe to try more intense activity such as jogging, treadmill, bicycling, low-impact aerobics, swimming, etc. b. Save the most intensive and strenuous activity for last such as sit-ups, heavy lifting, contact sports, etc  Refrain from any heavy lifting or straining until you are off narcotics for pain control.   c. DO NOT PUSH THROUGH PAIN.  Let pain be your guide: If it hurts to do something, don't do it.  Pain is your body warning you to avoid that activity for another week until the pain goes down. d. You may drive when you are no longer taking prescription pain medication, you can comfortably wear a seatbelt, and you can safely maneuver your car and apply brakes. e. Bonita QuinYou may have sexual intercourse when it is comfortable.  9. FOLLOW UP in our office a. Please call [facility] at [phone] to set up an appointment to see your surgeon in the office for a follow-up appointment approximately 2-3 weeks after your surgery. b. Make sure that you call for this appointment the day you arrive home to insure a convenient appointment time. 10. IF YOU HAVE DISABILITY OR FAMILY LEAVE FORMS, BRING THEM TO THE OFFICE FOR PROCESSING.  DO NOT GIVE THEM TO YOUR DOCTOR.   WHEN TO CALL US [phone]: 1. Poor pain control 2. Reactions / problems with new medications (rash/itching, nausea, etc)  3. Fever over 101.5 F (38.5 C) 4. Inability to urinate 5. Nausea and/or  vomiting 6. Jaundice 7. Worsening swelling or bruising 8. Continued bleeding from incision. 9. Increased pain, redness, or drainage from the incision   The clinic staff is available to answer your questions during regular business hours (8:30am-5pm).  Please dont hesitate to call and ask to speak to one of our nurses for clinical concerns.   If you have a medical emergency, go to the nearest emergency room or call 911.  [facility] is always on call at the [facility]   [facility], [place] [address], [address], [place], [place]  [address] ? MAIN: [phone] ? TOLL FREE: [phone] ?  FAX [phone] [email or web address]

## 2017-06-26 NOTE — Interval H&P Note (Signed)
History and Physical Interval Note:  06/26/2017 7:04 AM  Theresa MaiersAshley M Lerner  has presented today for surgery, with the diagnosis of cholecystitis  The various methods of treatment have been discussed with the patient and family. After consideration of risks, benefits and other options for treatment, the patient has consented to  Procedure(s): LAPAROSCOPIC CHOLECYSTECTOMY (N/A) as a surgical intervention .  The patient's history has been reviewed, patient examined, no change in status, stable for surgery.  I have reviewed the patient's chart and labs.  Questions were answered to the patient's satisfaction.     Foster Sonnier Lollie SailsA Genova Kiner

## 2017-06-26 NOTE — Anesthesia Procedure Notes (Signed)
Procedure Name: Intubation Date/Time: 06/26/2017 7:29 AM Performed by: Maxwell Caul, CRNA Pre-anesthesia Checklist: Patient identified, Emergency Drugs available, Suction available and Patient being monitored Patient Re-evaluated:Patient Re-evaluated prior to induction Oxygen Delivery Method: Circle system utilized Preoxygenation: Pre-oxygenation with 100% oxygen Induction Type: IV induction Laryngoscope Size: Mac and 4 Grade View: Grade I Tube type: Oral Tube size: 7.5 mm Number of attempts: 1 Airway Equipment and Method: Stylet Placement Confirmation: ETT inserted through vocal cords under direct vision,  positive ETCO2 and breath sounds checked- equal and bilateral Tube secured with: Tape Dental Injury: Teeth and Oropharynx as per pre-operative assessment

## 2017-06-26 NOTE — Transfer of Care (Signed)
Immediate Anesthesia Transfer of Care Note  Patient: Theresa Hansen  Procedure(s) Performed: LAPAROSCOPIC CHOLECYSTECTOMY (N/A )  Patient Location: PACU  Anesthesia Type:General  Level of Consciousness: awake, alert  and oriented  Airway & Oxygen Therapy: Patient Spontanous Breathing and Patient connected to face mask oxygen  Post-op Assessment: Report given to RN and Post -op Vital signs reviewed and stable  Post vital signs: Reviewed and stable  Last Vitals:  Vitals:   06/26/17 0545  BP: 134/80  Pulse: (!) 126  Resp: 16  Temp: 36.6 C  SpO2: 100%    Last Pain:  Vitals:   06/26/17 0545  TempSrc: Oral      Patients Stated Pain Goal: 4 (06/26/17 [id number])  Complications: No apparent anesthesia complications

## 2017-06-26 NOTE — Anesthesia Postprocedure Evaluation (Signed)
Anesthesia Post Note  Patient: Theresa Hansen  Procedure(s) Performed: LAPAROSCOPIC CHOLECYSTECTOMY (N/A )     Patient location during evaluation: PACU Anesthesia Type: General Level of consciousness: awake and alert Pain management: pain level controlled Vital Signs Assessment: post-procedure vital signs reviewed and stable Respiratory status: spontaneous breathing, nonlabored ventilation, respiratory function stable and patient connected to nasal cannula oxygen Cardiovascular status: blood pressure returned to baseline and stable Postop Assessment: no apparent nausea or vomiting Anesthetic complications: no    Last Vitals:  Vitals:   06/26/17 1040 06/26/17 1045  BP:  (!) 147/94  Pulse: (!) 102 (!) 112  Resp: (!) 22 18  Temp:  37.1 C  SpO2: 93% 94%    Last Pain:  Vitals:   06/26/17 1045  TempSrc:   PainSc: 1                  Arnitra Sokoloski S

## 2017-07-13 DIAGNOSIS — R319 Hematuria, unspecified: Secondary | ICD-10-CM | POA: Diagnosis not present

## 2017-07-13 DIAGNOSIS — Z1389 Encounter for screening for other disorder: Secondary | ICD-10-CM | POA: Diagnosis not present

## 2017-07-13 DIAGNOSIS — N390 Urinary tract infection, site not specified: Secondary | ICD-10-CM | POA: Diagnosis not present

## 2017-07-13 DIAGNOSIS — D649 Anemia, unspecified: Secondary | ICD-10-CM | POA: Diagnosis not present

## 2017-08-23 ENCOUNTER — Ambulatory Visit: Payer: BLUE CROSS/BLUE SHIELD | Admitting: Family Medicine

## 2017-08-23 DIAGNOSIS — L0390 Cellulitis, unspecified: Secondary | ICD-10-CM | POA: Diagnosis not present

## 2017-08-23 DIAGNOSIS — T8189XA Other complications of procedures, not elsewhere classified, initial encounter: Secondary | ICD-10-CM | POA: Diagnosis not present

## 2017-08-24 DIAGNOSIS — R110 Nausea: Secondary | ICD-10-CM | POA: Diagnosis not present

## 2017-08-24 DIAGNOSIS — L0390 Cellulitis, unspecified: Secondary | ICD-10-CM | POA: Diagnosis not present

## 2017-08-24 DIAGNOSIS — T8189XA Other complications of procedures, not elsewhere classified, initial encounter: Secondary | ICD-10-CM | POA: Diagnosis not present

## 2017-09-04 DIAGNOSIS — L7634 Postprocedural seroma of skin and subcutaneous tissue following other procedure: Secondary | ICD-10-CM | POA: Diagnosis not present

## 2017-09-12 DIAGNOSIS — L0390 Cellulitis, unspecified: Secondary | ICD-10-CM | POA: Diagnosis not present

## 2017-09-18 DIAGNOSIS — L7634 Postprocedural seroma of skin and subcutaneous tissue following other procedure: Secondary | ICD-10-CM | POA: Diagnosis not present

## 2017-09-18 DIAGNOSIS — L0390 Cellulitis, unspecified: Secondary | ICD-10-CM | POA: Diagnosis not present

## 2017-10-02 DIAGNOSIS — L0390 Cellulitis, unspecified: Secondary | ICD-10-CM | POA: Diagnosis not present

## 2017-10-02 DIAGNOSIS — L7634 Postprocedural seroma of skin and subcutaneous tissue following other procedure: Secondary | ICD-10-CM | POA: Diagnosis not present

## 2017-11-03 ENCOUNTER — Encounter: Payer: BLUE CROSS/BLUE SHIELD | Attending: Physician Assistant | Admitting: Physician Assistant

## 2017-11-03 DIAGNOSIS — L98492 Non-pressure chronic ulcer of skin of other sites with fat layer exposed: Secondary | ICD-10-CM | POA: Diagnosis not present

## 2017-11-03 DIAGNOSIS — S31100A Unspecified open wound of abdominal wall, right upper quadrant without penetration into peritoneal cavity, initial encounter: Secondary | ICD-10-CM | POA: Diagnosis not present

## 2017-11-05 NOTE — Progress Notes (Signed)
Theresa Hansen, Natausha M. ([id number]) Visit Report for 11/03/2017 Abuse/Suicide Risk Screen Details Patient Name: Theresa Hansen, Alizaya M. Date of Service: 11/03/2017 12:30 PM Medical Record Number: [id number] Patient Account Number: [id number] Date of Birth/Sex: 12/19/1995 (22 y.o. Female) Treating RN: Renne CriglerFlinchum, Cheryl Primary Care Cory Rama: Arville CareETTINGER, JOSHUA Other Clinician: Referring Abbygail [place]: Mitchel HonourMORRIS, MEGAN Treating Nelli Swalley/Extender: Linwood DibblesSTONE III, HOYT Weeks in Treatment: 0 Abuse/Suicide Risk Screen Items Answer ABUSE/SUICIDE RISK SCREEN: Has anyone close to you tried to hurt or harm you recentlyo No Do you feel uncomfortable with anyone in your familyo No Has anyone forced you do things that you didnot want to doo No Do you have any thoughts of harming yourselfo No Patient displays signs or symptoms of abuse and/or neglect. No Electronic Signature(s) Signed: 11/03/2017 4:42:38 PM By: Renne CriglerFlinchum, Cheryl Entered By: Renne CriglerFlinchum, Cheryl on 11/03/2017 12:46:02 Theresa Hansen, Ledonna M. ([id number]) -------------------------------------------------------------------------------- Activities of Daily Living Details Patient Name: Theresa Hansen, Elayjah M. Date of Service: 11/03/2017 12:30 PM Medical Record Number: [id number] Patient Account Number: [id number] Date of Birth/Sex: 12/19/1995 (22 y.o. Female) Treating RN: Renne CriglerFlinchum, Cheryl Primary Care Tashianna Broome: Arville CareETTINGER, JOSHUA Other Clinician: Referring Kandie Keiper: Mitchel HonourMORRIS, MEGAN Treating Corianna Avallone/Extender: Linwood DibblesSTONE III, HOYT Weeks in Treatment: 0 Activities of Daily Living Items Answer Activities of Daily Living (Please select one for each item) Drive Automobile Completely Able Take Medications Completely Able Use Telephone Completely Able Care for Appearance Completely Able Use Toilet Completely Able Bath / Shower Completely Able Dress Self Completely Able Feed Self Completely Able Walk Completely Able Get In / Out Bed Completely Able Housework Completely Able Prepare  Meals Completely Able Handle Money Completely Able Shop for Self Completely Able Electronic Signature(s) Signed: 11/03/2017 4:42:38 PM By: Renne CriglerFlinchum, Cheryl Entered By: Renne CriglerFlinchum, Cheryl on 11/03/2017 12:46:23 Theresa Hansen, Jenika M. ([id number]) -------------------------------------------------------------------------------- Education Assessment Details Patient Name: Theresa Hansen, Terez M. Date of Service: 11/03/2017 12:30 PM Medical Record Number: [id number] Patient Account Number: [id number] Date of Birth/Sex: 12/19/1995 (22 y.o. Female) Treating RN: Renne CriglerFlinchum, Cheryl Primary Care Denecia Brunette: Arville CareETTINGER, JOSHUA Other Clinician: Referring Libbi Towner: Mitchel HonourMORRIS, MEGAN Treating Rodrigo Mcgranahan/Extender: Skeet SimmerSTONE III, HOYT Weeks in Treatment: 0 Primary Learner Assessed: Patient Learning Preferences/Education Level/Primary Language Learning Preference: Explanation Highest Education Level: High School Preferred Language: English Cognitive Barrier Assessment/Beliefs Language Barrier: No Translator Needed: No Memory Deficit: No Emotional Barrier: No Cultural/Religious Beliefs Affecting Medical Care: No Physical Barrier Assessment Impaired Vision: No Impaired Hearing: No Decreased Hand dexterity: No Knowledge/Comprehension Assessment Knowledge Level: High Comprehension Level: High Ability to understand written High instructions: Ability to understand verbal High instructions: Motivation Assessment Anxiety Level: Calm Cooperation: Cooperative Education Importance: Acknowledges Need Interest in Health Problems: Asks Questions Perception: Coherent Willingness to Engage in Self- High Management Activities: Readiness to Engage in Self- High Management Activities: Electronic Signature(s) Signed: 11/03/2017 4:42:38 PM By: Renne CriglerFlinchum, Cheryl Entered By: Renne CriglerFlinchum, Cheryl on 11/03/2017 12:47:02 Theresa Hansen, Staley M. ([id number]) -------------------------------------------------------------------------------- Fall  Risk Assessment Details Patient Name: Theresa Hansen, Abegail M. Date of Service: 11/03/2017 12:30 PM Medical Record Number: [id number] Patient Account Number: [id number] Date of Birth/Sex: 12/19/1995 (22 y.o. Female) Treating RN: Renne CriglerFlinchum, Cheryl Primary Care Elody Kleinsasser: Arville CareETTINGER, JOSHUA Other Clinician: Referring Loma [place]: Mitchel HonourMORRIS, MEGAN Treating Merrel Crabbe/Extender: Linwood DibblesSTONE III, HOYT Weeks in Treatment: 0 Fall Risk Assessment Items Have you had 2 or more falls in the last 12 monthso 0 No Have you had any fall that resulted in injury in the last 12 monthso 0 No FALL RISK ASSESSMENT: History of falling - immediate or within 3 months 0 No Secondary diagnosis 0 No Ambulatory aid None/bed rest/wheelchair/nurse 0 No Crutches/cane/walker 0 No Furniture 0 No  IV Access/Saline Lock 0 No Gait/Training Normal/bed rest/immobile 0 No Weak 0 No Impaired 0 No Mental Status Oriented to own ability 0 No Electronic Signature(s) Signed: 11/03/2017 4:42:38 PM By: Renne Crigler Entered By: Renne Crigler on 11/03/2017 12:47:10 Theresa Finner ([id number]) -------------------------------------------------------------------------------- Nutrition Risk Assessment Details Patient Name: Theresa Finner Date of Service: 11/03/2017 12:30 PM Medical Record Number: [id number] Patient Account Number: [id number] Date of Birth/Sex: 07/01/1995 (22 y.o. Female) Treating RN: Renne Crigler Primary Care Christyana Corwin: Arville Care Other Clinician: Referring Glynda Soliday: Mitchel Honour Treating Alhassan Everingham/Extender: Linwood Dibbles, HOYT Weeks in Treatment: 0 Height (in): 64 Weight (lbs): Body Mass Index (BMI): Nutrition Risk Assessment Items NUTRITION RISK SCREEN: I have an illness or condition that made me change the kind and/or amount of 0 No food I eat I eat fewer than two meals per day 0 No I eat few fruits and vegetables, or milk products 0 No I have three or more drinks of beer, liquor or wine almost every day 0  No I have tooth or mouth problems that make it hard for me to eat 0 No I don't always have enough money to buy the food I need 0 No I eat alone most of the time 0 No I take three or more different prescribed or over-the-counter drugs a day 0 No Without wanting to, I have lost or gained 10 pounds in the last six months 0 No I am not always physically able to shop, cook and/or feed myself 0 No Nutrition Protocols Good Risk Protocol 0 No interventions needed Moderate Risk Protocol Electronic Signature(s) Signed: 11/03/2017 4:42:38 PM By: Renne Crigler Entered By: Renne Crigler on 11/03/2017 12:47:15

## 2017-11-05 NOTE — Progress Notes (Addendum)
Theresa Hansen, Theresa M. ([id number]) Visit Report for 11/03/2017 Allergy List Details Patient Name: Theresa Hansen, Theresa M. Date of Service: 11/03/2017 12:30 PM Medical Record Number: [id number] Patient Account Number: [id number] Date of Birth/Sex: June 19, 1995 (21 y.o. Female) Treating RN: Renne CriglerFlinchum, Cheryl Primary Care Jerimyah Vandunk: Theresa Hansen, Theresa Hansen Other Clinician: Referring Laurie Lovejoy: Mitchel HonourMORRIS, MEGAN Treating Konnor Jorden/Extender: Linwood DibblesSTONE III, HOYT Weeks in Treatment: 0 Allergies Active Allergies codiene Allergy Notes Electronic Signature(s) Signed: 11/03/2017 4:42:38 PM By: Renne CriglerFlinchum, Cheryl Entered By: Renne CriglerFlinchum, Cheryl on 11/03/2017 12:40:35 Theresa Hansen, Theresa M. ([id number]) -------------------------------------------------------------------------------- Arrival Information Details Patient Name: Theresa Hansen, Theresa M. Date of Service: 11/03/2017 12:30 PM Medical Record Number: [id number] Patient Account Number: [id number] Date of Birth/Sex: June 19, 1995 (21 y.o. Female) Treating RN: Renne CriglerFlinchum, Cheryl Primary Care Verneda Hollopeter: Theresa Hansen, Theresa Hansen Other Clinician: Referring Gilford Lardizabal: Mitchel HonourMORRIS, MEGAN Treating Katniss Weedman/Extender: Linwood DibblesSTONE III, HOYT Weeks in Treatment: 0 Visit Information Patient Arrived: Ambulatory Arrival Time: 12:38 Accompanied By: mom Transfer Assistance: None Patient Identification Verified: Yes Secondary Verification Process Completed: Yes Electronic Signature(s) Signed: 11/03/2017 4:42:38 PM By: Renne CriglerFlinchum, Cheryl Entered By: Renne CriglerFlinchum, Cheryl on 11/03/2017 12:39:08 Theresa Hansen, Theresa M. ([id number]) -------------------------------------------------------------------------------- Clinic Level of Care Assessment Details Patient Name: Theresa Hansen, [name] Date of Service: 11/03/2017 12:30 PM Medical Record Number: [id number] Patient Account Number: [id number] Date of Birth/Sex: June 19, 1995 (21 y.o. Female) Treating RN: Curtis Sitesorthy, Joanna Primary Care Juletta Berhe: Theresa Hansen, Theresa Hansen Other Clinician: Referring  Marketia Stallsmith: Mitchel HonourMORRIS, MEGAN Treating Shamonique Battiste/Extender: Linwood DibblesSTONE III, HOYT Weeks in Treatment: 0 Clinic Level of Care Assessment Items TOOL 2 Quantity Score []  - Use when only an EandM is performed on the INITIAL visit 0 ASSESSMENTS - Nursing Assessment / Reassessment X - General Physical Exam (combine w/ comprehensive assessment (listed just below) when 1 20 performed on new pt. evals) X- 1 25 Comprehensive Assessment (HX, ROS, Risk Assessments, Wounds Hx, etc.) ASSESSMENTS - Wound and Skin Assessment / Reassessment X - Simple Wound Assessment / Reassessment - one wound 1 5 []  - 0 Complex Wound Assessment / Reassessment - multiple wounds []  - 0 Dermatologic / Skin Assessment (not related to wound area) ASSESSMENTS - Ostomy and/or Continence Assessment and Care []  - Incontinence Assessment and Management 0 []  - 0 Ostomy Care Assessment and Management (repouching, etc.) PROCESS - Coordination of Care X - Simple Patient / Family Education for ongoing care 1 15 []  - 0 Complex (extensive) Patient / Family Education for ongoing care []  - 0 Staff obtains ChiropractorConsents, Records, Test Results / Process Orders []  - 0 Staff telephones HHA, Nursing Homes / Clarify orders / etc []  - 0 Routine Transfer to another Facility (non-emergent condition) []  - 0 Routine Hospital Admission (non-emergent condition) X- 1 15 New Admissions / Manufacturing engineernsurance Authorizations / Ordering NPWT, Apligraf, etc. []  - 0 Emergency Hospital Admission (emergent condition) X- 1 10 Simple Discharge Coordination []  - 0 Complex (extensive) Discharge Coordination PROCESS - Special Needs []  - Pediatric / Minor Patient Management 0 []  - 0 Isolation Patient Management Theresa Hansen, Theresa M. ([id number]) []  - 0 Hearing / Language / Visual special needs []  - 0 Assessment of Community assistance (transportation, D/C planning, etc.) []  - 0 Additional assistance / Altered mentation []  - 0 Support Surface(s) Assessment (bed, cushion,  seat, etc.) INTERVENTIONS - Wound Cleansing / Measurement X - Wound Imaging (photographs - any number of wounds) 1 5 []  - 0 Wound Tracing (instead of photographs) X- 1 5 Simple Wound Measurement - one wound []  - 0 Complex Wound Measurement - multiple wounds X- 1 5 Simple Wound Cleansing - one wound []  - 0 Complex Wound Cleansing -  multiple wounds INTERVENTIONS - Wound Dressings X - Small Wound Dressing one or multiple wounds 1 10 []  - 0 Medium Wound Dressing one or multiple wounds []  - 0 Large Wound Dressing one or multiple wounds []  - 0 Application of Medications - injection INTERVENTIONS - Miscellaneous []  - External ear exam 0 []  - 0 Specimen Collection (cultures, biopsies, blood, body fluids, etc.) []  - 0 Specimen(s) / Culture(s) sent or taken to Lab for analysis []  - 0 Patient Transfer (multiple staff / Michiel Sites Lift / Similar devices) []  - 0 Simple Staple / Suture removal (25 or less) []  - 0 Complex Staple / Suture removal (26 or more) []  - 0 Hypo / Hyperglycemic Management (close monitor of Blood Glucose) []  - 0 Ankle / Brachial Index (ABI) - do not check if billed separately Has the patient been seen at the hospital within the last three years: Yes Total Score: 115 Level Of Care: New/Established - Level 3 Electronic Signature(s) Signed: 11/03/2017 4:49:47 PM By: Curtis Sites Entered By: Curtis Sites on 11/03/2017 13:35:36 Theresa Hansen ([id number]) -------------------------------------------------------------------------------- Encounter Discharge Information Details Patient Name: Theresa Hansen Date of Service: 11/03/2017 12:30 PM Medical Record Number: [id number] Patient Account Number: [id number] Date of Birth/Sex: 07-21-1995 (21 y.o. Female) Treating RN: Huel Coventry Primary Care Sanya Kobrin: Theresa Care Other Clinician: Referring Merle Cirelli: Mitchel Honour Treating Asif Muchow/Extender: Linwood Dibbles, HOYT Weeks in Treatment: 0 Encounter Discharge  Information Items Discharge Condition: Stable Ambulatory Status: Ambulatory Discharge Destination: Home Transportation: Private Auto Accompanied By: mother Schedule Follow-up Appointment: Yes Clinical Summary of Care: Electronic Signature(s) Signed: 11/03/2017 3:19:46 PM By: Elliot Gurney, BSN, RN, CWS, Kim RN, BSN Entered By: Elliot Gurney, BSN, RN, CWS, Kim on 11/03/2017 15:19:46 Theresa Hansen ([id number]) -------------------------------------------------------------------------------- Lower Extremity Assessment Details Patient Name: Theresa Hansen Date of Service: 11/03/2017 12:30 PM Medical Record Number: [id number] Patient Account Number: [id number] Date of Birth/Sex: October 15, 1995 (21 y.o. Female) Treating RN: Renne Crigler Primary Care Katonya Blecher: Theresa Care Other Clinician: Referring Maela Takeda: Mitchel Honour Treating Steffen Hase/Extender: Linwood Dibbles, HOYT Weeks in Treatment: 0 Electronic Signature(s) Signed: 11/03/2017 4:42:38 PM By: Renne Crigler Entered By: Renne Crigler on 11/03/2017 13:00:22 Theresa Hansen ([id number]) -------------------------------------------------------------------------------- Multi Wound Chart Details Patient Name: Theresa Hansen Date of Service: 11/03/2017 12:30 PM Medical Record Number: [id number] Patient Account Number: [id number] Date of Birth/Sex: Oct 01, 1995 (21 y.o. Female) Treating RN: Curtis Sites Primary Care Wassim Kirksey: Theresa Care Other Clinician: Referring Birtie Fellman: Mitchel Honour Treating Jacoby Zanni/Extender: Linwood Dibbles, HOYT Weeks in Treatment: 0 Vital Signs Height(in): 64 Pulse(bpm): 89 Weight(lbs): 149 Blood Pressure(mmHg): 127/78 Body Mass Index(BMI): 26 Temperature(F): 98.4 Respiratory Rate 18 (breaths/min): Photos: [1:No Photos] [2:No Photos] [N/A:N/A] Wound Location: [1:Right Abdomen - Lower Quadrant] [2:Left Abdomen - Lower Quadrant] [N/A:N/A] Wounding Event: [1:Surgical Injury] [2:Surgical Injury]  [N/A:N/A] Primary Etiology: [1:Dehisced Wound] [2:Dehisced Wound] [N/A:N/A] Date Acquired: [1:09/03/2017] [2:09/03/2017] [N/A:N/A] Weeks of Treatment: [1:0] [2:0] [N/A:N/A] Wound Status: [1:Open] [2:Open] [N/A:N/A] Measurements L x W x D [1:1x2.6x1.3] [2:0.7x0.2x0.6] [N/A:N/A] (cm) Area (cm) : [1:2.042] [2:0.11] [N/A:N/A] Volume (cm) : [1:2.655] [2:0.066] [N/A:N/A] Position 1 (o'clock): [1:3] [2:9] Maximum Distance 1 (cm): [1:2.2] [2:6] Tunneling: [1:Yes] [2:Yes] [N/A:N/A] Classification: [1:Full Thickness Without Exposed Support Structures] [2:Full Thickness Without Exposed Support Structures] [N/A:N/A] Exudate Amount: [1:Large] [2:Large] [N/A:N/A] Exudate Type: [1:Serosanguineous] [2:Serosanguineous] [N/A:N/A] Exudate Color: [1:red, brown] [2:red, brown] [N/A:N/A] Wound Margin: [1:Distinct, outline attached] [2:Indistinct, nonvisible] [N/A:N/A] Granulation Amount: [1:Large (67-100%)] [2:Large (67-100%)] [N/A:N/A] Granulation Quality: [1:Red, Pink] [2:Red, Pink] [N/A:N/A] Necrotic Amount: [1:Small (1-33%)] [2:Small (1-33%)] [N/A:N/A] Exposed Structures: [1:Fascia: No Fat Layer (Subcutaneous  Tissue) Exposed: No Tendon: No Muscle: No Joint: No Bone: No] [2:Fat Layer (Subcutaneous Tissue) Exposed: Yes Fascia: No Tendon: No Muscle: No Joint: No Bone: No] [N/A:N/A] Epithelialization: [1:None] [2:None] [N/A:N/A] Periwound Skin Texture: [1:Excoriation: No Induration: No Callus: No Crepitus: No] [2:Excoriation: No Induration: No Callus: No Crepitus: No] [N/A:N/A] Rash: No Rash: No Scarring: No Scarring: No Periwound Skin Moisture: Maceration: No Maceration: No N/A Dry/Scaly: No Dry/Scaly: No Periwound Skin Color: Atrophie Blanche: No Atrophie Blanche: No N/A Cyanosis: No Cyanosis: No Ecchymosis: No Ecchymosis: No Erythema: No Erythema: No Hemosiderin Staining: No Hemosiderin Staining: No Mottled: No Mottled: No Pallor: No Pallor: No Rubor: No Rubor: No Temperature: N/A  No Abnormality N/A Tenderness on Palpation: No No N/A Wound Preparation: Ulcer Cleansing: Ulcer Cleansing: N/A Rinsed/Irrigated with Saline Rinsed/Irrigated with Saline Topical Anesthetic Applied: Topical Anesthetic Applied: Other: lidocaine % Other: lidocAINE 4% Treatment Notes Electronic Signature(s) Signed: 11/03/2017 4:49:47 PM By: Curtis Sites Entered By: Curtis Sites on 11/03/2017 13:27:51 Theresa Hansen ([id number]) -------------------------------------------------------------------------------- Multi-Disciplinary Care Plan Details Patient Name: Theresa Hansen Date of Service: 11/03/2017 12:30 PM Medical Record Number: [id number] Patient Account Number: [id number] Date of Birth/Sex: October 19, 1995 (21 y.o. Female) Treating RN: Curtis Sites Primary Care Janila Arrazola: Theresa Care Other Clinician: Referring Lawton Dollinger: Mitchel Honour Treating Nisaiah Bechtol/Extender: Linwood Dibbles, HOYT Weeks in Treatment: 0 Active Inactive ` Orientation to the Wound Care Program Nursing Diagnoses: Knowledge deficit related to the wound healing center program Goals: Patient/caregiver will verbalize understanding of the Wound Healing Center Program Date Initiated: 11/03/2017 Target Resolution Date: 01/20/2018 Goal Status: Active Interventions: Provide education on orientation to the wound center Notes: ` Wound/Skin Impairment Nursing Diagnoses: Impaired tissue integrity Goals: Ulcer/skin breakdown will heal within 14 weeks Date Initiated: 11/03/2017 Target Resolution Date: 01/20/2018 Goal Status: Active Interventions: Assess patient/caregiver ability to obtain necessary supplies Assess patient/caregiver ability to perform ulcer/skin care regimen upon admission and as needed Assess ulceration(s) every visit Notes: Electronic Signature(s) Signed: 11/03/2017 4:49:47 PM By: Curtis Sites Entered By: Curtis Sites on 11/03/2017 13:27:39 Theresa Hansen  ([id number]) -------------------------------------------------------------------------------- Pain Assessment Details Patient Name: Theresa Hansen Date of Service: 11/03/2017 12:30 PM Medical Record Number: [id number] Patient Account Number: [id number] Date of Birth/Sex: [birth year] (21 y.o. Female) Treating RN: Renne Crigler Primary Care Savana Spina: Theresa Care Other Clinician: Referring Doneisha Ivey: Mitchel Honour Treating Dorien Mayotte/Extender: Linwood Dibbles, HOYT Weeks in Treatment: 0 Active Problems Location of Pain Severity and Description of Pain Patient Has Paino No Site Locations Pain Management and Medication Current Pain Management: Electronic Signature(s) Signed: 11/03/2017 4:42:38 PM By: Renne Crigler Entered By: Renne Crigler on 11/03/2017 12:39:15 Theresa Hansen ([id number]) -------------------------------------------------------------------------------- Patient/Caregiver Education Details Patient Name: Theresa Hansen Date of Service: 11/03/2017 12:30 PM Medical Record Number: [id number] Patient Account Number: [id number] Date of Birth/Gender: August 28, 1995 (21 y.o. Female) Treating RN: Huel Coventry Primary Care Physician: Theresa Care Other Clinician: Referring Physician: Mitchel Honour Treating Physician/Extender: Skeet Simmer in Treatment: 0 Education Assessment Education Provided To: Patient Education Topics Provided Wound/Skin Impairment: Handouts: Caring for Your Ulcer Methods: Demonstration, Explain/Verbal Responses: State content correctly Electronic Signature(s) Signed: 11/03/2017 4:42:56 PM By: Elliot Gurney, BSN, RN, CWS, Kim RN, BSN Entered By: Elliot Gurney, BSN, RN, CWS, Kim on 11/03/2017 15:19:59 Theresa Hansen ([id number]) -------------------------------------------------------------------------------- Wound Assessment Details Patient Name: Theresa Hansen Date of Service: 11/03/2017 12:30 PM Medical Record Number: [id number] Patient  Account Number: [id number] Date of Birth/Sex: [birth year] (21 y.o. Female) Treating RN: Renne Crigler Primary Care Dalexa Gentz: Theresa Care Other Clinician: Referring Cristofer Yaffe: Mitchel Honour Treating [place] Searles/Extender:  STONE III, HOYT Weeks in Treatment: 0 Wound Status Wound Number: 1 Primary Etiology: Dehisced Wound Wound Location: Right Abdomen - Lower Quadrant Wound Status: Open Wounding Event: Surgical Injury Date Acquired: 09/03/2017 Weeks Of Treatment: 0 Clustered Wound: No Photos Photo Uploaded By: Renne Crigler on 11/03/2017 14:41:40 Wound Measurements Length: (cm) 1 Width: (cm) 2.6 Depth: (cm) 1.3 Area: (cm) 2.042 Volume: (cm) 2.655 % Reduction in Area: % Reduction in Volume: Epithelialization: None Tunneling: Yes Position (o'clock): 3 Maximum Distance: (cm) 2.2 Wound Description Full Thickness Without Exposed Support Classification: Structures Wound Margin: Distinct, outline attached Exudate Large Amount: Exudate Type: Serosanguineous Exudate Color: red, brown Foul Odor After Cleansing: No Slough/Fibrino Yes Wound Bed Granulation Amount: Large (67-100%) Exposed Structure Granulation Quality: Red, Pink Fascia Exposed: No Necrotic Amount: Small (1-33%) Fat Layer (Subcutaneous Tissue) Exposed: No Necrotic Quality: Adherent Slough Tendon Exposed: No Muscle Exposed: No Bors, Jenette M. ([id number]) Joint Exposed: No Bone Exposed: No Periwound Skin Texture Texture Color No Abnormalities Noted: No No Abnormalities Noted: No Callus: No Atrophie Blanche: No Crepitus: No Cyanosis: No Excoriation: No Ecchymosis: No Induration: No Erythema: No Rash: No Hemosiderin Staining: No Scarring: No Mottled: No Pallor: No Moisture Rubor: No No Abnormalities Noted: No Dry / Scaly: No Maceration: No Wound Preparation Ulcer Cleansing: Rinsed/Irrigated with Saline Topical Anesthetic Applied: Other: lidocaine %, Treatment Notes Wound #1  (Right Abdomen - Lower Quadrant) 1. Cleansed with: Clean wound with Normal Saline 2. Anesthetic Topical Lidocaine 4% cream to wound bed prior to debridement 4. Dressing Applied: Prisma Ag Iodoform packing Gauze 5. Secondary Dressing Applied ABD Pad Notes Prisma Ag, packing strip from both wounds toward the middle. BFD for drainage and to secure. Electronic Signature(s) Signed: 11/03/2017 4:42:38 PM By: Renne Crigler Entered By: Renne Crigler on 11/03/2017 12:54:55 Theresa Hansen ([id number]) -------------------------------------------------------------------------------- Wound Assessment Details Patient Name: Theresa Hansen Date of Service: 11/03/2017 12:30 PM Medical Record Number: [id number] Patient Account Number: [id number] Date of Birth/Sex: 12-17-1995 (21 y.o. Female) Treating RN: Renne Crigler Primary Care Natividad Halls: Theresa Care Other Clinician: Referring Micalah Cabezas: Mitchel Honour Treating Coyt Govoni/Extender: Linwood Dibbles, HOYT Weeks in Treatment: 0 Wound Status Wound Number: 2 Primary Etiology: Dehisced Wound Wound Location: Left Abdomen - Lower Quadrant Wound Status: Open Wounding Event: Surgical Injury Date Acquired: 09/03/2017 Weeks Of Treatment: 0 Clustered Wound: No Photos Photo Uploaded By: Renne Crigler on 11/03/2017 14:41:41 Wound Measurements Length: (cm) 0.7 Width: (cm) 0.2 Depth: (cm) 0.6 Area: (cm) 0.11 Volume: (cm) 0.066 % Reduction in Area: % Reduction in Volume: Epithelialization: None Tunneling: Yes Position (o'clock): 9 Maximum Distance: (cm) 6 Undermining: No Wound Description Full Thickness Without Exposed Support Classification: Structures Wound Margin: Indistinct, nonvisible Exudate Large Amount: Exudate Type: Serosanguineous Exudate Color: red, brown Foul Odor After Cleansing: No Slough/Fibrino Yes Wound Bed Granulation Amount: Large (67-100%) Exposed Structure Granulation Quality: Red, Pink Fascia  Exposed: No Necrotic Amount: Small (1-33%) Fat Layer (Subcutaneous Tissue) Exposed: Yes Necrotic Quality: Adherent Slough Tendon Exposed: No Gahm, Gwynn M. ([id number]) Muscle Exposed: No Joint Exposed: No Bone Exposed: No Periwound Skin Texture Texture Color No Abnormalities Noted: No No Abnormalities Noted: No Callus: No Atrophie Blanche: No Crepitus: No Cyanosis: No Excoriation: No Ecchymosis: No Induration: No Erythema: No Rash: No Hemosiderin Staining: No Scarring: No Mottled: No Pallor: No Moisture Rubor: No No Abnormalities Noted: No Dry / Scaly: No Temperature / Pain Maceration: No Temperature: No Abnormality Wound Preparation Ulcer Cleansing: Rinsed/Irrigated with Saline Topical Anesthetic Applied: Other: lidocAINE 4%, Treatment Notes Wound #2 (Left Abdomen - Lower  Quadrant) 1. Cleansed with: Clean wound with Normal Saline 2. Anesthetic Topical Lidocaine 4% cream to wound bed prior to debridement 4. Dressing Applied: Prisma Ag Iodoform packing Gauze 5. Secondary Dressing Applied ABD Pad Notes Prisma Ag, packing strip from both wounds toward the middle. BFD for drainage and to secure. Electronic Signature(s) Signed: 11/03/2017 4:42:38 PM By: Renne Crigler Entered By: Renne Crigler on 11/03/2017 13:00:12 Theresa Hansen ([id number]) -------------------------------------------------------------------------------- Vitals Details Patient Name: Theresa Hansen Date of Service: 11/03/2017 12:30 PM Medical Record Number: [id number] Patient Account Number: [id number] Date of Birth/Sex: [birth year] (21 y.o. Female) Treating RN: Renne Crigler Primary Care Vinal Rosengrant: Theresa Care Other Clinician: Referring Boniface Goffe: Mitchel Honour Treating Namiyah Grantham/Extender: Linwood Dibbles, HOYT Weeks in Treatment: 0 Vital Signs Time Taken: 12:39 Temperature (F): 98.4 Height (in): 64 Pulse (bpm): 89 Source: Stated Respiratory Rate (breaths/min):  18 Weight (lbs): 149 Blood Pressure (mmHg): 127/78 Source: Measured Reference Range: 80 - 120 mg / dl Body Mass Index (BMI): 25.6 Electronic Signature(s) Signed: 11/03/2017 4:42:38 PM By: Renne Crigler Entered By: Renne Crigler on 11/03/2017 13:01:50

## 2017-11-06 NOTE — Progress Notes (Signed)
Theresa Hansen, Theresa Hansen ([id number]) Visit Report for 11/03/2017 Chief Complaint Document Details Patient Name: Theresa Hansen, Theresa Hansen. Date of Service: 11/03/2017 12:30 PM Medical Record Number: [id number] Patient Account Number: [id number] Date of Birth/Sex: 25-Apr-1996 (22 y.o. Female) Treating RN: Curtis Sites Primary Care Provider: Arville Care Other Clinician: Referring Provider: Mitchel Honour Treating Provider/Extender: Lenda Kelp Weeks in Treatment: 0 Information Obtained from: Patient Chief Complaint Abdominal ulcer s/p c-section January 2019 with dehiscence April 2019 Electronic Signature(s) Signed: 11/06/2017 12:55:36 AM By: Lenda Kelp PA-C Entered By: Lenda Kelp on 11/06/2017 00:14:54 Theresa Hansen ([id number]) -------------------------------------------------------------------------------- HPI Details Patient Name: Theresa Hansen Date of Service: 11/03/2017 12:30 PM Medical Record Number: [id number] Patient Account Number: [id number] Date of Birth/Sex: 11-21-1995 (22 y.o. Female) Treating RN: Curtis Sites Primary Care Provider: Arville Care Other Clinician: Referring Provider: Mitchel Honour Treating Provider/Extender: Linwood Dibbles, Christabel Camire Weeks in Treatment: 0 History of Present Illness HPI Description: /21/19 on evaluation today patient presents for initial evaluation concerning two wounds on each the lateral aspect of her abdominal C-section incision. This surgery was actually performed January 2019 and apparently went fairly well although the reason for the C-section was secondary to arrest of dilation at 9 cm. Fortunately delivery went well but unfortunately her postpartum course is complicated by cholecystitis requiring emergent gallbladder removal. Per the patient the surgeon who performed the surgery stated that "this was the worst gallbladder they had ever seen" subsequently following that surgery she was also doing well. On 20 January when she  was seen for a postpartum visit incision looked excellent with no evidence of opening at that point. However on August 23, 2017 she presented back to the OB/GYN office due to a small opening that had occurred at the incision site. There was no fever, chills, she had no significant bleeding and no, pain. With that being said I did agree with the note which was reviewed although I did review multiple notes where it was stated that this was an unusual course and that it appears ace aroma developed but did not show itself until three months after the initial surgery. Unfortunately following that August 23, 2017 visit the patient subsequently goes through several visits which I did review the records of as well where she basically had one side which would open and then that would be treated the other side was closed and then the opposite will happen with the opposite side opening in the initial side closing. This continued until things appear to be almost completely sealed at the end of May 2019 only to unfortunately completely open more significantly on both sides at which point she was referred to wound care for further evaluation and treatment. This is the state in which I see her today upon my initial evaluation. There does not appear to be any fevers, chills, nausea, vomiting, or diarrhea. The patient really has no major medical problems other than the wound which is given her enough trouble she states. She does take Tylenol as needed she is bottlefeeding at this point and she also takes protonix 20 mg daily. Overall she does have some discomfort but mainly because cleansing or touching wound location. Electronic Signature(s) Signed: 11/06/2017 12:55:36 AM By: Lenda Kelp PA-C Entered By: Lenda Kelp on 11/06/2017 00:18:56 Theresa Hansen ([id number]) -------------------------------------------------------------------------------- Physical Exam Details Patient Name: Theresa Hansen Date of Service: 11/03/2017 12:30 PM Medical Record Number: [id number] Patient Account Number: [id number] Date of Birth/Sex: 09/21/1995 (22 y.o. Female) Treating RN: Dorthy,  Mardene Celeste Primary Care Provider: Arville Care Other Clinician: Referring Provider: Mitchel Honour Treating Provider/Extender: Linwood Dibbles, Delonna Ney Weeks in Treatment: 0 Constitutional alert and oriented x 3. sitting or standing blood pressure is within target range for patient.. pulse regular and within target range for patient.Marland Kitchen respirations regular, non-labored and within target range for patient.Marland Kitchen temperature within target range for patient.. Well-nourished and well-hydrated in no acute distress. Eyes conjunctiva clear no eyelid edema noted. pupils equal round and reactive to light and accommodation. Ears, Nose, Mouth, and Throat no gross abnormality of ear auricles or external auditory canals. normal hearing noted during conversation. mucus membranes moist. Respiratory normal breathing without difficulty. clear to auscultation bilaterally. Cardiovascular regular rate and rhythm with normal S1, S2. 2+ dorsalis pedis/posterior tibialis pulses. no clubbing, cyanosis, significant edema, <3 sec cap refill. Gastrointestinal (GI) soft, non-tender, non-distended, +BS. no hepatosplenomegaly. no ventral hernia noted. Musculoskeletal normal gait and posture. no significant deformity or arthritic changes, no loss or range of motion, no clubbing. Psychiatric this patient is able to make decisions and demonstrates good insight into disease process. Alert and Oriented x 3. pleasant and cooperative. Notes Patient's wound on inspection today actually does have an opening on the right and left portions of her surgical incision. The right is actually much larger as far as the actual opening is concerned and tunnels towards the left the left likewise is smaller than the right as far as the opening is concerned it also tunnels to  the right. These do actually meet in the middle of the tunnel that I see at this point actually travels completely through the wound/incision site in the middle connecting both wounds together. I think this may be one reason why she's had such a hard time getting this area to heal. I believe they may have had an issue where she would be treating one side while the other appeared close only to be developing a silver Roma and then once they got one side close the other side will subsequently open. This has progressed over the past two months prior to referral to wound care. Electronic Signature(s) Signed: 11/06/2017 12:55:36 AM By: Lenda Kelp PA-C Entered By: Lenda Kelp on 11/06/2017 00:21:48 Theresa Hansen ([id number]) -------------------------------------------------------------------------------- Physician Orders Details Patient Name: Theresa Hansen Date of Service: 11/03/2017 12:30 PM Medical Record Number: [id number] Patient Account Number: [id number] Date of Birth/Sex: May 19, 1995 (22 y.o. Female) Treating RN: Curtis Sites Primary Care Provider: Arville Care Other Clinician: Referring Provider: Mitchel Honour Treating Provider/Extender: Linwood Dibbles, Wilver Tignor Weeks in Treatment: 0 Verbal / Phone Orders: No Diagnosis Coding Wound Cleansing Wound #1 Right Abdomen - Lower Quadrant o Clean wound with Normal Saline. o May Shower, gently pat wound dry prior to applying new dressing. Wound #2 Left Abdomen - Lower Quadrant o Clean wound with Normal Saline. o May Shower, gently pat wound dry prior to applying new dressing. Primary Wound Dressing Wound #1 Right Abdomen - Lower Quadrant o Silver Collagen o Iodoform packing Gauze - 1/4 inch Wound #2 Left Abdomen - Lower Quadrant o Silver Collagen o Iodoform packing Gauze - 1/4 inch Secondary Dressing Wound #1 Right Abdomen - Lower Quadrant o Boardered Foam Dressing Wound #2 Left Abdomen - Lower  Quadrant o Boardered Foam Dressing Dressing Change Frequency Wound #1 Right Abdomen - Lower Quadrant o Change dressing every day. Wound #2 Left Abdomen - Lower Quadrant o Change dressing every day. Follow-up Appointments Wound #1 Right Abdomen - Lower Quadrant o Return Appointment in 1 week. Wound #  2 Left Abdomen - Lower Quadrant o Return Appointment in 1 week. Additional Orders / Instructions Wound #1 Right Abdomen - Lower Quadrant o Vitamin A; Vitamin C, Zinc - PLEASE BE SURE YOU ARE TAKING A MULTIVITAMIN WITH VITAMIN A, VITAMIN C AND ZINC Echavarria, Marykatherine M. ([id number]) o Increase protein intake. o Activity as tolerated Wound #2 Left Abdomen - Lower Quadrant o Vitamin A; Vitamin C, Zinc - PLEASE BE SURE YOU ARE TAKING A MULTIVITAMIN WITH VITAMIN A, VITAMIN C AND ZINC o Increase protein intake. o Activity as tolerated Electronic Signature(s) Signed: 11/03/2017 4:49:47 PM By: Curtis Sites Signed: 11/06/2017 12:55:36 AM By: Lenda Kelp PA-C Entered By: Curtis Sites on 11/03/2017 13:34:55 Theresa Hansen ([id number]) -------------------------------------------------------------------------------- Problem List Details Patient Name: Theresa Hansen Date of Service: 11/03/2017 12:30 PM Medical Record Number: [id number] Patient Account Number: [id number] Date of Birth/Sex: 1996-03-27 (22 y.o. 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 Female) Treating RN: Curtis Sites Primary Care Provider: Arville Care Other Clinician: Referring Provider: Mitchel Honour Treating Provider/Extender: Lenda Kelp Weeks in Treatment: 0 Subjective Chief Complaint Information obtained from Patient Abdominal ulcer s/p c-section January 2019 with dehiscence April 2019 History of Present Illness (HPI) /21/19 on evaluation today patient presents for initial evaluation concerning two wounds on each the lateral aspect of her abdominal C-section incision. This surgery was actually performed January 2019 and apparently went fairly well although the reason for the C-section was secondary to arrest of dilation at 9 cm. Fortunately delivery went well but unfortunately her postpartum course is complicated by cholecystitis requiring emergent gallbladder removal. Per the patient the surgeon who performed the surgery stated that "this was the worst gallbladder they had ever seen" subsequently following that surgery she was also doing well. On 20 January when she was seen for a postpartum visit incision looked excellent with no evidence of opening at that point. However on August 23, 2017 she presented back to the OB/GYN office due to a small opening that had occurred at the incision site. There was no fever, chills, she had no significant bleeding and no, pain. With that being said I did agree with the note which was reviewed although I did review multiple notes where it was stated that this was an unusual course and that it  appears ace aroma developed but did not show itself until three months after the initial surgery. Unfortunately following that August 23, 2017 visit the patient subsequently goes through several visits which I did review the records of as well where she basically had one side which would open and then that would be treated the other side was closed and then the opposite will happen with the opposite side opening in the initial side closing. This continued until things appear to be almost completely sealed at the end of May  2019 only to unfortunately completely open more significantly on both sides at which point she was referred to wound care for further evaluation and treatment. This is the state in which I see her today upon my initial evaluation. There does not appear to be any fevers, chills, nausea, vomiting, or diarrhea. The patient really has no major medical problems other than the wound which is given her enough trouble she states. She does take Tylenol as needed she is bottlefeeding at this point and she also takes protonix 20 mg daily. Overall she does have some discomfort but mainly because cleansing or touching wound location. Wound History Patient presents with 2 open wounds that have been present for approximately 2 months. Patient has been treating wounds in the following manner: packing with wet to dry. Laboratory tests have not been performed in the last month. Patient reportedly has not tested positive for an antibiotic resistant organism. Patient reportedly has not tested positive for osteomyelitis. Patient reportedly has not had testing performed to evaluate circulation in the legs. Patient History Allergies codiene Family History Diabetes - Paternal Grandparents, Heart Disease - Maternal Grandparents, Hypertension - Maternal Grandparents,Paternal Grandparents,Mother,Father, Seizures - Maternal Grandparents, Stroke - Maternal Grandparents,Paternal Grandparents, Thyroid  Problems - Paternal Grandparents,Maternal Grandparents, No family history of Cancer, Kidney Disease, Lung Disease, Tuberculosis. Social History Never smoker, Marital Status - Single, Alcohol Use - Never, Drug Use - No History, Caffeine Use - Daily. Theresa Hansen, Theresa Hansen ([id number]) Medical History Eyes Denies history of Cataracts, Glaucoma, Optic Neuritis Ear/Nose/Mouth/Throat Denies history of Chronic sinus problems/congestion, Middle ear problems Hematologic/Lymphatic Denies history of Anemia, Hemophilia, Human Immunodeficiency Virus, Lymphedema, Sickle Cell Disease Respiratory Denies history of Aspiration, Asthma, Chronic Obstructive Pulmonary Disease (COPD), Pneumothorax, Sleep Apnea, Tuberculosis Cardiovascular Denies history of Angina, Arrhythmia, Congestive Heart Failure, Coronary Artery Disease, Deep Vein Thrombosis, Hypertension, Hypotension, Myocardial Infarction, Peripheral Arterial Disease, Peripheral Venous Disease, Phlebitis, Vasculitis Gastrointestinal Denies history of Cirrhosis , Colitis, Crohn s, Hepatitis A, Hepatitis B, Hepatitis C Endocrine Denies history of Type I Diabetes, Type II Diabetes Genitourinary Denies history of End Stage Renal Disease Immunological Denies history of Lupus Erythematosus, Raynaud s, Scleroderma Integumentary (Skin) Denies history of History of Burn, History of pressure wounds Musculoskeletal Denies history of Gout, Rheumatoid Arthritis, Osteoarthritis, Osteomyelitis Neurologic Denies history of Dementia, Neuropathy, Quadriplegia, Seizure Disorder Psychiatric Denies history of Anorexia/bulimia, Confinement Anxiety Review of Systems (ROS) Constitutional Symptoms (General Health) Denies complaints or symptoms of Fatigue, Fever, Chills, Marked Weight Change. Eyes Complains or has symptoms of Glasses / Contacts - prescription. Denies complaints or symptoms of Dry Eyes, Vision Changes. Ear/Nose/Mouth/Throat Denies complaints or symptoms  of Difficult clearing ears, Sinusitis. Hematologic/Lymphatic Denies complaints or symptoms of Bleeding / Clotting Disorders, Human Immunodeficiency Virus. Respiratory Denies complaints or symptoms of Chronic or frequent coughs, Shortness of Breath. Cardiovascular Denies complaints or symptoms of Chest pain, LE edema. Gastrointestinal Denies complaints or symptoms of Frequent diarrhea, Nausea, Vomiting. Endocrine Denies complaints or symptoms of Hepatitis, Thyroid disease, Polydypsia (Excessive Thirst). Genitourinary Denies complaints or symptoms of Kidney failure/ Dialysis, Incontinence/dribbling. Immunological Denies complaints or symptoms of Hives, Itching. Integumentary (Skin) Denies complaints or symptoms of Wounds, Bleeding or bruising tendency, Breakdown, Swelling. Musculoskeletal Denies complaints or symptoms of Muscle Pain, Muscle Weakness. Neurologic Denies complaints or symptoms of Numbness/parasthesias, Focal/Weakness. Oncologic Theresa Hansen, Theresa Hansen ([id number]) The patient has no complaints or symptoms. Psychiatric Denies complaints or symptoms of Anxiety, Claustrophobia. Objective Constitutional alert and oriented x 3. sitting or standing blood pressure is within target range for patient.. pulse  regular and within target range for patient.Marland Kitchen respirations regular, non-labored and within target range for patient.Marland Kitchen temperature within target range for patient.. Well-nourished and well-hydrated in no acute distress. Vitals Time Taken: 12:39 PM, Height: 64 in, Source: Stated, Weight: 149 lbs, Source: Measured, BMI: 25.6, Temperature: 98.4 F, Pulse: 89 bpm, Respiratory Rate: 18 breaths/min, Blood Pressure: 127/78 mmHg. Eyes conjunctiva clear no eyelid edema noted. pupils equal round and reactive to light and accommodation. Ears, Nose, Mouth, and Throat no gross abnormality of ear auricles or external auditory canals. normal hearing noted during conversation. mucus membranes  moist. Respiratory normal breathing without difficulty. clear to auscultation bilaterally. Cardiovascular regular rate and rhythm with normal S1, S2. 2+ dorsalis pedis/posterior tibialis pulses. no clubbing, cyanosis, significant edema, Gastrointestinal (GI) soft, non-tender, non-distended, +BS. no hepatosplenomegaly. no ventral hernia noted. Musculoskeletal normal gait and posture. no significant deformity or arthritic changes, no loss or range of motion, no clubbing. Psychiatric this patient is able to make decisions and demonstrates good insight into disease process. Alert and Oriented x 3. pleasant and cooperative. General Notes: Patient's wound on inspection today actually does have an opening on the right and left portions of her surgical incision. The right is actually much larger as far as the actual opening is concerned and tunnels towards the left the left likewise is smaller than the right as far as the opening is concerned it also tunnels to the right. These do actually meet in the middle of the tunnel that I see at this point actually travels completely through the wound/incision site in the middle connecting both wounds together. I think this may be one reason why she's had such a hard time getting this area to heal. I believe they may have had an issue where she would be treating one side while the other appeared close only to be developing a silver Roma and then once they got one side close the other side will subsequently open. This has progressed over the past two months prior to referral to wound care. Integumentary (Hair, Skin) Wound #1 status is Open. Original cause of wound was Surgical Injury. The wound is located on the Right Abdomen - Lower Quadrant. The wound measures 1cm length x 2.6cm width x 1.3cm depth; 2.042cm^2 area and 2.655cm^3 volume. There is Sako, Kathie M. ([id number]) tunneling at 3:00 with a maximum distance of 2.2cm. There is a large amount of  serosanguineous drainage noted. The wound margin is distinct with the outline attached to the wound base. There is large (67-100%) red, pink granulation within the wound bed. There is a small (1-33%) amount of necrotic tissue within the wound bed including Adherent Slough. The periwound skin appearance did not exhibit: Callus, Crepitus, Excoriation, Induration, Rash, Scarring, Dry/Scaly, Maceration, Atrophie Blanche, Cyanosis, Ecchymosis, Hemosiderin Staining, Mottled, Pallor, Rubor, Erythema. Wound #2 status is Open. Original cause of wound was Surgical Injury. The wound is located on the Left Abdomen - Lower Quadrant. The wound measures 0.7cm length x 0.2cm width x 0.6cm depth; 0.11cm^2 area and 0.066cm^3 volume. There is Fat Layer (Subcutaneous Tissue) Exposed exposed. There is no undermining noted, however, there is tunneling at 9:00 with a maximum distance of 6cm. There is a large amount of serosanguineous drainage noted. The wound margin is indistinct and nonvisible. There is large (67-100%) red, pink granulation within the wound bed. There is a small (1-33%) amount of necrotic tissue within the wound bed including Adherent Slough. The periwound skin appearance did not exhibit: Callus, Crepitus, Excoriation, Induration, Rash, Scarring,  Dry/Scaly, Maceration, Atrophie Blanche, Cyanosis, Ecchymosis, Hemosiderin Staining, Mottled, Pallor, Rubor, Erythema. Periwound temperature was noted as No Abnormality. Assessment Active Problems ICD-10 Disruption of external operation (surgical) wound, not elsewhere classified, initial encounter Non-pressure chronic ulcer of skin of other sites with fat layer exposed Plan Wound Cleansing: Wound #1 Right Abdomen - Lower Quadrant: Clean wound with Normal Saline. May Shower, gently pat wound dry prior to applying new dressing. Wound #2 Left Abdomen - Lower Quadrant: Clean wound with Normal Saline. May Shower, gently pat wound dry prior to applying new  dressing. Primary Wound Dressing: Wound #1 Right Abdomen - Lower Quadrant: Silver Collagen Iodoform packing Gauze - 1/4 inch Wound #2 Left Abdomen - Lower Quadrant: Silver Collagen Iodoform packing Gauze - 1/4 inch Secondary Dressing: Wound #1 Right Abdomen - Lower Quadrant: Boardered Foam Dressing Wound #2 Left Abdomen - Lower Quadrant: Boardered Foam Dressing Dressing Change Frequency: Wound #1 Right Abdomen - Lower Quadrant: Change dressing every day. Wound #2 Left Abdomen - Lower Quadrant: Change dressing every day. Follow-up Appointments: Theresa Hansen, Theresa Hansen ([id number]) Wound #1 Right Abdomen - Lower Quadrant: Return Appointment in 1 week. Wound #2 Left Abdomen - Lower Quadrant: Return Appointment in 1 week. Additional Orders / Instructions: Wound #1 Right Abdomen - Lower Quadrant: Vitamin A; Vitamin C, Zinc - PLEASE BE SURE YOU ARE TAKING A MULTIVITAMIN WITH VITAMIN A, VITAMIN C AND ZINC Increase protein intake. Activity as tolerated Wound #2 Left Abdomen - Lower Quadrant: Vitamin A; Vitamin C, Zinc - PLEASE BE SURE YOU ARE TAKING A MULTIVITAMIN WITH VITAMIN A, VITAMIN C AND ZINC Increase protein intake. Activity as tolerated At this time I'm recommending that we initiate the above wound care orders for the next week. The good news is I see no evidence of infection and I think overall the patient is going to do very well in that regard. With that being said I do think that the wound will likely heal but I do believe we're gonna have to practice for both sides order to accomplish what we are looking for. Especially since both wounds meet in the middle and obviously we got to get both to close not just one side or the other. The patient understands. We will provide of use the silver collagen along with Iodoform packing strips quarter inch to treat the wound. Please see above for specific wound care orders. We will see patient for re-evaluation in 1 week(s) here in the  clinic. If anything worsens or changes patient will contact our office for additional recommendations. Electronic Signature(s) Signed: 11/06/2017 12:55:36 AM By: Lenda Kelp PA-C Entered By: Lenda Kelp on 11/06/2017 00:22:51 Theresa Hansen ([id number]) -------------------------------------------------------------------------------- ROS/PFSH Details Patient Name: Theresa Hansen Date of Service: 11/03/2017 12:30 PM Medical Record Number: [id number] Patient Account Number: [id number] Date of Birth/Sex: 03-21-1996 (22 y.o. Female) Treating RN: Renne Crigler Primary Care Provider: Arville Care Other Clinician: Referring Provider: Mitchel Honour Treating Provider/Extender: Linwood Dibbles, Dyon Rotert Weeks in Treatment: 0 Wound History Do you currently have one or more open woundso Yes How many open wounds do you currently haveo 2 Approximately how long have you had your woundso 2 months How have you been treating your wound(s) until nowo packing with wet to dry Has your wound(s) ever healed and then re-openedo No Have you had any lab work done in the past montho No Have you tested positive for an antibiotic resistant organism (MRSA, VRE)o No Have you tested positive for osteomyelitis (bone infection)o No Have you had  any tests for circulation on your legso No Constitutional Symptoms (General Health) Complaints and Symptoms: Negative for: Fatigue; Fever; Chills; Marked Weight Change Eyes Complaints and Symptoms: Positive for: Glasses / Contacts - prescription Negative for: Dry Eyes; Vision Changes Medical History: Negative for: Cataracts; Glaucoma; Optic Neuritis Ear/Nose/Mouth/Throat Complaints and Symptoms: Negative for: Difficult clearing ears; Sinusitis Medical History: Negative for: Chronic sinus problems/congestion; Middle ear problems Hematologic/Lymphatic Complaints and Symptoms: Negative for: Bleeding / Clotting Disorders; Human Immunodeficiency Virus Medical  History: Negative for: Anemia; Hemophilia; Human Immunodeficiency Virus; Lymphedema; Sickle Cell Disease Respiratory Complaints and Symptoms: Negative for: Chronic or frequent coughs; Shortness of Breath Medical History: Negative for: Aspiration; Asthma; Chronic Obstructive Pulmonary Disease (COPD); Pneumothorax; Sleep Apnea; Tuberculosis Theresa FinnerBULLINS, Theresa M. ([id number]) Cardiovascular Complaints and Symptoms: Negative for: Chest pain; LE edema Medical History: Negative for: Angina; Arrhythmia; Congestive Heart Failure; Coronary Artery Disease; Deep Vein Thrombosis; Hypertension; Hypotension; Myocardial Infarction; Peripheral Arterial Disease; Peripheral Venous Disease; Phlebitis; Vasculitis Gastrointestinal Complaints and Symptoms: Negative for: Frequent diarrhea; Nausea; Vomiting Medical History: Negative for: Cirrhosis ; Colitis; Crohnos; Hepatitis A; Hepatitis B; Hepatitis C Endocrine Complaints and Symptoms: Negative for: Hepatitis; Thyroid disease; Polydypsia (Excessive Thirst) Medical History: Negative for: Type I Diabetes; Type II Diabetes Genitourinary Complaints and Symptoms: Negative for: Kidney failure/ Dialysis; Incontinence/dribbling Medical History: Negative for: End Stage Renal Disease Immunological Complaints and Symptoms: Negative for: Hives; Itching Medical History: Negative for: Lupus Erythematosus; Raynaudos; Scleroderma Integumentary (Skin) Complaints and Symptoms: Negative for: Wounds; Bleeding or bruising tendency; Breakdown; Swelling Medical History: Negative for: History of Burn; History of pressure wounds Musculoskeletal Complaints and Symptoms: Negative for: Muscle Pain; Muscle Weakness Medical History: Negative for: Gout; Rheumatoid Arthritis; Osteoarthritis; Osteomyelitis Neurologic Theresa Hansen, Theresa M. ([id number]) Complaints and Symptoms: Negative for: Numbness/parasthesias; Focal/Weakness Medical History: Negative for: Dementia;  Neuropathy; Quadriplegia; Seizure Disorder Psychiatric Complaints and Symptoms: Negative for: Anxiety; Claustrophobia Medical History: Negative for: Anorexia/bulimia; Confinement Anxiety Oncologic Complaints and Symptoms: No Complaints or Symptoms Immunizations Pneumococcal Vaccine: Received Pneumococcal Vaccination: No Implantable Devices Family and Social History Cancer: No; Diabetes: Yes - Paternal Grandparents; Heart Disease: Yes - Maternal Grandparents; Hypertension: Yes - Maternal Grandparents,Paternal Grandparents,Mother,Father; Kidney Disease: No; Lung Disease: No; Seizures: Yes - Maternal Grandparents; Stroke: Yes - Maternal Grandparents,Paternal Grandparents; Thyroid Problems: Yes - Paternal Grandparents,Maternal Grandparents; Tuberculosis: No; Never smoker; Marital Status - Single; Alcohol Use: Never; Drug Use: No History; Caffeine Use: Daily; Financial Concerns: No; Food, Clothing or Shelter Needs: No; Support System Lacking: No; Transportation Concerns: No; Advanced Directives: No; Patient does not want information on Advanced Directives; Do not resuscitate: No; Living Will: No; Medical Power of Attorney: No Electronic Signature(s) Signed: 11/03/2017 4:42:38 PM By: Renne CriglerFlinchum, Cheryl Signed: 11/06/2017 12:55:36 AM By: Lenda KelpStone III, Eligha Kmetz PA-C Entered By: Renne CriglerFlinchum, Cheryl on 11/03/2017 12:45:54

## 2017-11-10 ENCOUNTER — Encounter: Payer: BLUE CROSS/BLUE SHIELD | Admitting: Physician Assistant

## 2017-11-10 DIAGNOSIS — L98492 Non-pressure chronic ulcer of skin of other sites with fat layer exposed: Secondary | ICD-10-CM | POA: Diagnosis not present

## 2017-11-12 NOTE — Progress Notes (Signed)
MAEBY, [name] ([id number]) Visit Report for 11/10/2017 Arrival Information Details Patient Name: Theresa Hansen, Theresa Hansen. Date of Service: 11/10/2017 2:00 PM Medical Record Number: [id number] Patient Account Number: [id number] Date of Birth/Sex: Apr 19, 1996 (21 y.o. F) Treating RN: Rema Jasmine Primary Care Damoni Erker: Arville Care Other Clinician: Referring Mearl Olver: Arville Care Treating Denese Mentink/Extender: Linwood Dibbles, HOYT Weeks in Treatment: 1 Visit Information History Since Last Visit Added or deleted any medications: No Patient Arrived: Ambulatory Any new allergies or adverse reactions: No Arrival Time: 14:35 Had a fall or experienced change in No Accompanied By: family activities of daily living that may affect Transfer Assistance: None risk of falls: Patient Identification Verified: Yes Signs or symptoms of abuse/neglect since last visito No Secondary Verification Process Completed: Yes Hospitalized since last visit: No Implantable device outside of the clinic excluding No cellular tissue based products placed in the center since last visit: Has Dressing in Place as Prescribed: Yes Pain Present Now: No Electronic Signature(s) Signed: 11/10/2017 4:01:49 PM By: Rema Jasmine Entered By: Rema Jasmine on 11/10/2017 14:35:53 Theresa Hansen ([id number]) -------------------------------------------------------------------------------- Clinic Level of Care Assessment Details Patient Name: Theresa Hansen Date of Service: 11/10/2017 2:00 PM Medical Record Number: [id number] Patient Account Number: [id number] Date of Birth/Sex: 1996/03/14 (21 y.o. F) Treating RN: Curtis Sites Primary Care Erikah Thumm: Arville Care Other Clinician: Referring Vega Stare: Arville Care Treating Shayma Pfefferle/Extender: Linwood Dibbles, HOYT Weeks in Treatment: 1 Clinic Level of Care Assessment Items TOOL 4 Quantity Score []  - Use when only an EandM is performed on FOLLOW-UP visit 0 ASSESSMENTS - Nursing  Assessment / Reassessment X - Reassessment of Co-morbidities (includes updates in patient status) 1 10 X- 1 5 Reassessment of Adherence to Treatment Plan ASSESSMENTS - Wound and Skin Assessment / Reassessment []  - Simple Wound Assessment / Reassessment - one wound 0 X- 2 5 Complex Wound Assessment / Reassessment - multiple wounds []  - 0 Dermatologic / Skin Assessment (not related to wound area) ASSESSMENTS - Focused Assessment []  - Circumferential Edema Measurements - multi extremities 0 []  - 0 Nutritional Assessment / Counseling / Intervention []  - 0 Lower Extremity Assessment (monofilament, tuning fork, pulses) []  - 0 Peripheral Arterial Disease Assessment (using hand held doppler) ASSESSMENTS - Ostomy and/or Continence Assessment and Care []  - Incontinence Assessment and Management 0 []  - 0 Ostomy Care Assessment and Management (repouching, etc.) PROCESS - Coordination of Care X - Simple Patient / Family Education for ongoing care 1 15 []  - 0 Complex (extensive) Patient / Family Education for ongoing care []  - 0 Staff obtains Chiropractor, Records, Test Results / Process Orders []  - 0 Staff telephones HHA, Nursing Homes / Clarify orders / etc []  - 0 Routine Transfer to another Facility (non-emergent condition) []  - 0 Routine Hospital Admission (non-emergent condition) []  - 0 New Admissions / Manufacturing engineer / Ordering NPWT, Apligraf, etc. []  - 0 Emergency Hospital Admission (emergent condition) X- 1 10 Simple Discharge Coordination [name], [name]. ([id number]) []  - 0 Complex (extensive) Discharge Coordination PROCESS - Special Needs []  - Pediatric / Minor Patient Management 0 []  - 0 Isolation Patient Management []  - 0 Hearing / Language / Visual special needs []  - 0 Assessment of Community assistance (transportation, D/C planning, etc.) []  - 0 Additional assistance / Altered mentation []  - 0 Support Surface(s) Assessment (bed, cushion, seat,  etc.) INTERVENTIONS - Wound Cleansing / Measurement []  - Simple Wound Cleansing - one wound 0 X- 2 5 Complex Wound Cleansing - multiple wounds X- 1 5 Wound Imaging (photographs -  any number of wounds) []  - 0 Wound Tracing (instead of photographs) []  - 0 Simple Wound Measurement - one wound X- 2 5 Complex Wound Measurement - multiple wounds INTERVENTIONS - Wound Dressings X - Small Wound Dressing one or multiple wounds 2 10 []  - 0 Medium Wound Dressing one or multiple wounds []  - 0 Large Wound Dressing one or multiple wounds []  - 0 Application of Medications - topical []  - 0 Application of Medications - injection INTERVENTIONS - Miscellaneous []  - External ear exam 0 []  - 0 Specimen Collection (cultures, biopsies, blood, body fluids, etc.) []  - 0 Specimen(s) / Culture(s) sent or taken to Lab for analysis []  - 0 Patient Transfer (multiple staff / Nurse, adult / Similar devices) []  - 0 Simple Staple / Suture removal (25 or less) []  - 0 Complex Staple / Suture removal (26 or more) []  - 0 Hypo / Hyperglycemic Management (close monitor of Blood Glucose) []  - 0 Ankle / Brachial Index (ABI) - do not check if billed separately X- 1 5 Vital Signs [name], [name] ([id number]) Has the patient been seen at the hospital within the last three years: Yes Total Score: 100 Level Of Care: New/Established - Level 3 Electronic Signature(s) Signed: 11/10/2017 4:59:39 PM By: Curtis Sites Entered By: Curtis Sites on 11/10/2017 15:11:40 Theresa Hansen ([id number]) -------------------------------------------------------------------------------- Lower Extremity Assessment Details Patient Name: Theresa Hansen Date of Service: 11/10/2017 2:00 PM Medical Record Number: [id number] Patient Account Number: [id number] Date of Birth/Sex: 09/23/1995 (21 y.o. F) Treating RN: Rema Jasmine Primary Care Amin Fornwalt: Arville Care Other Clinician: Referring Betty Daidone: Arville Care Treating  Naviah Belfield/Extender: Linwood Dibbles, HOYT Weeks in Treatment: 1 Electronic Signature(s) Signed: 11/10/2017 4:01:49 PM By: Rema Jasmine Entered By: Rema Jasmine on 11/10/2017 14:39:57 Theresa Hansen ([id number]) -------------------------------------------------------------------------------- Multi Wound Chart Details Patient Name: Theresa Hansen Date of Service: 11/10/2017 2:00 PM Medical Record Number: [id number] Patient Account Number: [id number] Date of Birth/Sex: 11/10/1995 (21 y.o. F) Treating RN: Curtis Sites Primary Care Lorey Pallett: Arville Care Other Clinician: Referring Maycee Blasco: Arville Care Treating Manning Luna/Extender: Linwood Dibbles, HOYT Weeks in Treatment: 1 Vital Signs Height(in): 64 Pulse(bpm): 90 Weight(lbs): 149 Blood Pressure(mmHg): 130/75 Body Mass Index(BMI): 26 Temperature(F): 98.7 Respiratory Rate 16 (breaths/min): Photos: [N/A:N/A] Wound Location: Right Abdomen - Lower Left Abdomen - Lower N/A Quadrant Quadrant Wounding Event: Surgical Injury Surgical Injury N/A Primary Etiology: Dehisced Wound Dehisced Wound N/A Date Acquired: 09/03/2017 09/03/2017 N/A Weeks of Treatment: 1 1 N/A Wound Status: Open Open N/A Measurements L x W x D 0.6x2.6x1 0.3x0.6x1.6 N/A (cm) Area (cm) : 1.225 0.141 N/A Volume (cm) : 1.225 0.226 N/A % Reduction in Area: 40.00% -28.20% N/A % Reduction in Volume: 53.90% -242.40% N/A Position 1 (o'clock): 12 Maximum Distance 1 (cm): 1.6 Tunneling: No Yes N/A Classification: Full Thickness Without Full Thickness Without N/A Exposed Support Structures Exposed Support Structures Exudate Amount: Large Large N/A Exudate Type: Serosanguineous Serosanguineous N/A Exudate Color: red, brown red, brown N/A Wound Margin: Distinct, outline attached Indistinct, nonvisible N/A Granulation Amount: Large (67-100%) Large (67-100%) N/A Granulation Quality: Red, Pink Red, Pink N/A Necrotic Amount: Small (1-33%) Small (1-33%) N/A Exposed  Structures: Fascia: No Fat Layer (Subcutaneous N/A Fat Layer (Subcutaneous Tissue) Exposed: Yes Tissue) Exposed: No Fascia: No Tendon: No Tendon: No Muscle: No Muscle: No Hansen, Theresa M. ([id number]) Joint: No Joint: No Bone: No Bone: No Epithelialization: None None N/A Periwound Skin Texture: Excoriation: No Excoriation: No N/A Induration: No Induration: No Callus: No Callus: No Crepitus: No Crepitus: No Rash:  No Rash: No Scarring: No Scarring: No Periwound Skin Moisture: Maceration: No Maceration: No N/A Dry/Scaly: No Dry/Scaly: No Periwound Skin Color: Atrophie Blanche: No Atrophie Blanche: No N/A Cyanosis: No Cyanosis: No Ecchymosis: No Ecchymosis: No Erythema: No Erythema: No Hemosiderin Staining: No Hemosiderin Staining: No Mottled: No Mottled: No Pallor: No Pallor: No Rubor: No Rubor: No Temperature: N/A No Abnormality N/A Tenderness on Palpation: No No N/A Wound Preparation: Ulcer Cleansing: Ulcer Cleansing: N/A Rinsed/Irrigated with Saline Rinsed/Irrigated with Saline Topical Anesthetic Applied: Topical Anesthetic Applied: Other: lidocaine % Other: lidocAINE 4% Treatment Notes Electronic Signature(s) Signed: 11/10/2017 4:59:39 PM By: Curtis Sites Entered By: Curtis Sites on 11/10/2017 15:08:32 Theresa Hansen ([id number]) -------------------------------------------------------------------------------- Multi-Disciplinary Care Plan Details Patient Name: Theresa Hansen Date of Service: 11/10/2017 2:00 PM Medical Record Number: [id number] Patient Account Number: [id number] Date of Birth/Sex: [birth year] (21 y.o. F) Treating RN: Curtis Sites Primary Care Shanaia Sievers: Arville Care Other Clinician: Referring Avrian Delfavero: Arville Care Treating Laron Boorman/Extender: Linwood Dibbles, HOYT Weeks in Treatment: 1 Active Inactive ` Orientation to the Wound Care Program Nursing Diagnoses: Knowledge deficit related to the wound healing  center program Goals: Patient/caregiver will verbalize understanding of the Wound Healing Center Program Date Initiated: 11/03/2017 Target Resolution Date: 01/20/2018 Goal Status: Active Interventions: Provide education on orientation to the wound center Notes: ` Wound/Skin Impairment Nursing Diagnoses: Impaired tissue integrity Goals: Ulcer/skin breakdown will heal within 14 weeks Date Initiated: 11/03/2017 Target Resolution Date: 01/20/2018 Goal Status: Active Interventions: Assess patient/caregiver ability to obtain necessary supplies Assess patient/caregiver ability to perform ulcer/skin care regimen upon admission and as needed Assess ulceration(s) every visit Notes: Electronic Signature(s) Signed: 11/10/2017 4:59:39 PM By: Curtis Sites Entered By: Curtis Sites on 11/10/2017 15:08:25 Theresa Hansen ([id number]) -------------------------------------------------------------------------------- Pain Assessment Details Patient Name: Theresa Hansen Date of Service: 11/10/2017 2:00 PM Medical Record Number: [id number] Patient Account Number: [id number] Date of Birth/Sex: 15-May-1996 (21 y.o. F) Treating RN: Rema Jasmine Primary Care Sharlene Mccluskey: Arville Care Other Clinician: Referring Bryanda Mikel: Arville Care Treating Froylan Hobby/Extender: Linwood Dibbles, HOYT Weeks in Treatment: 1 Active Problems Location of Pain Severity and Description of Pain Patient Has Paino No Site Locations Pain Management and Medication Current Pain Management: Electronic Signature(s) Signed: 11/10/2017 4:01:49 PM By: Rema Jasmine Entered By: Rema Jasmine on 11/10/2017 14:36:05 Theresa Hansen ([id number]) -------------------------------------------------------------------------------- Wound Assessment Details Patient Name: Theresa Hansen Date of Service: 11/10/2017 2:00 PM Medical Record Number: [id number] Patient Account Number: [id number] Date of Birth/Sex: [birth year] (21 y.o. F) Treating RN:  Rema Jasmine Primary Care Maryana Pittmon: Arville Care Other Clinician: Referring Carina Chaplin: Arville Care Treating Karron Alvizo/Extender: Linwood Dibbles, HOYT Weeks in Treatment: 1 Wound Status Wound Number: 1 Primary Etiology: Dehisced Wound Wound Location: Right Abdomen - Lower Quadrant Wound Status: Open Wounding Event: Surgical Injury Date Acquired: 09/03/2017 Weeks Of Treatment: 1 Clustered Wound: No Photos Photo Uploaded By: Rema Jasmine on 11/10/2017 15:00:54 Wound Measurements Length: (cm) 0.6 Width: (cm) 2.6 Depth: (cm) 1 Area: (cm) 1.225 Volume: (cm) 1.225 % Reduction in Area: 40% % Reduction in Volume: 53.9% Epithelialization: None Tunneling: No Undermining: No Wound Description Full Thickness Without Exposed Support Foul Odo Classification: Structures Slough/F Wound Margin: Distinct, outline attached Exudate Large Amount: Exudate Type: Serosanguineous Exudate Color: red, brown r After Cleansing: No ibrino Yes Wound Bed Granulation Amount: Large (67-100%) Exposed Structure Granulation Quality: Red, Pink Fascia Exposed: No Necrotic Amount: Small (1-33%) Fat Layer (Subcutaneous Tissue) Exposed: No Necrotic Quality: Adherent Slough Tendon Exposed: No Muscle Exposed: No Joint Exposed: No Bone Exposed: No Periwound Skin Texture  Texture Color Theresa FinnerBULLINS, Theresa M. ([id number]) No Abnormalities Noted: No No Abnormalities Noted: No Callus: No Atrophie Blanche: No Crepitus: No Cyanosis: No Excoriation: No Ecchymosis: No Induration: No Erythema: No Rash: No Hemosiderin Staining: No Scarring: No Mottled: No Pallor: No Moisture Rubor: No No Abnormalities Noted: No Dry / Scaly: No Maceration: No Wound Preparation Ulcer Cleansing: Rinsed/Irrigated with Saline Topical Anesthetic Applied: Other: lidocaine %, Electronic Signature(s) Signed: 11/10/2017 4:01:49 PM By: Rema JasmineNg, Wendi Entered By: Rema JasmineNg, Wendi on 11/10/2017 14:56:07 Theresa FinnerBULLINS, Theresa M.  ([id number]) -------------------------------------------------------------------------------- Wound Assessment Details Patient Name: Theresa FinnerBULLINS, Theresa M. Date of Service: 11/10/2017 2:00 PM Medical Record Number: [id number] Patient Account Number: [id number] Date of Birth/Sex: 04-23-1996 (21 y.o. F) Treating RN: Rema JasmineNg, Wendi Primary Care Lisamarie Coke: Arville CareETTINGER, JOSHUA Other Clinician: Referring Minard Millirons: Arville CareETTINGER, JOSHUA Treating Emmanuelle Hibbitts/Extender: Linwood DibblesSTONE III, HOYT Weeks in Treatment: 1 Wound Status Wound Number: 2 Primary Etiology: Dehisced Wound Wound Location: Left Abdomen - Lower Quadrant Wound Status: Open Wounding Event: Surgical Injury Date Acquired: 09/03/2017 Weeks Of Treatment: 1 Clustered Wound: No Photos Photo Uploaded By: Rema JasmineNg, Wendi on 11/10/2017 15:00:55 Wound Measurements Length: (cm) 0.3 Width: (cm) 0.6 Depth: (cm) 1.6 Area: (cm) 0.141 Volume: (cm) 0.226 % Reduction in Area: -28.2% % Reduction in Volume: -242.4% Epithelialization: None Tunneling: Yes Position (o'clock): 12 Maximum Distance: (cm) 1.6 Undermining: No Wound Description Full Thickness Without Exposed Support Foul Odo Classification: Structures Slough/F Wound Margin: Indistinct, nonvisible Exudate Large Amount: Exudate Type: Serosanguineous Exudate Color: red, brown r After Cleansing: No ibrino No Wound Bed Granulation Amount: Large (67-100%) Exposed Structure Granulation Quality: Red, Pink Fascia Exposed: No Necrotic Amount: Small (1-33%) Fat Layer (Subcutaneous Tissue) Exposed: Yes Tendon Exposed: No Muscle Exposed: No Joint Exposed: No Bone Exposed: No Levinson, Noe M. ([id number]) Periwound Skin Texture Texture Color No Abnormalities Noted: No No Abnormalities Noted: No Callus: No Atrophie Blanche: No Crepitus: No Cyanosis: No Excoriation: No Ecchymosis: No Induration: No Erythema: No Rash: No Hemosiderin Staining: No Scarring: No Mottled: No Pallor:  No Moisture Rubor: No No Abnormalities Noted: No Dry / Scaly: No Temperature / Pain Maceration: No Temperature: No Abnormality Wound Preparation Ulcer Cleansing: Rinsed/Irrigated with Saline Topical Anesthetic Applied: Other: lidocAINE 4%, Electronic Signature(s) Signed: 11/10/2017 4:01:49 PM By: Rema JasmineNg, Wendi Entered By: Rema JasmineNg, Wendi on 11/10/2017 14:51:33 Theresa FinnerBULLINS, Theresa M. ([id number]) -------------------------------------------------------------------------------- Vitals Details Patient Name: Theresa FinnerBULLINS, Theresa M. Date of Service: 11/10/2017 2:00 PM Medical Record Number: [id number] Patient Account Number: [id number] Date of Birth/Sex: 04-23-1996 (21 y.o. F) Treating RN: Rema JasmineNg, Wendi Primary Care Lian Pounds: Arville CareETTINGER, JOSHUA Other Clinician: Referring Khalia Gong: Arville CareETTINGER, JOSHUA Treating Arsal Tappan/Extender: Linwood DibblesSTONE III, HOYT Weeks in Treatment: 1 Vital Signs Time Taken: 14:30 Temperature (F): 98.7 Height (in): 64 Pulse (bpm): 90 Weight (lbs): 149 Respiratory Rate (breaths/min): 16 Body Mass Index (BMI): 25.6 Blood Pressure (mmHg): 130/75 Reference Range: 80 - 120 mg / dl Electronic Signature(s) Signed: 11/10/2017 4:01:49 PM By: Rema JasmineNg, Wendi Entered ByRema Jasmine: Ng, Wendi on 11/10/2017 14:39:25

## 2017-11-14 NOTE — Progress Notes (Signed)
ARIDAY, BRINKER ([id number]) Visit Report for 11/10/2017 Chief Complaint Document Details Patient Name: Theresa Hansen, Theresa Hansen. Date of Service: 11/10/2017 2:00 PM Medical Record Number: [id number] Patient Account Number: [id number] Date of Birth/Sex: 26-Jan-1996 (22 y.o. F) Treating RN: Curtis Sites Primary Care Provider: Arville Care Other Clinician: Referring Provider: Arville Care Treating Provider/Extender: Linwood Dibbles, HOYT Weeks in Treatment: 1 Information Obtained from: Patient Chief Complaint Abdominal ulcer s/p c-section January 2019 with dehiscence April 2019 Electronic Signature(s) Signed: 11/11/2017 12:34:34 PM By: Lenda Kelp PA-C Entered By: Lenda Kelp on 11/10/2017 14:28:19 Theresa Hansen ([id number]) -------------------------------------------------------------------------------- HPI Details Patient Name: Theresa Hansen Date of Service: 11/10/2017 2:00 PM Medical Record Number: [id number] Patient Account Number: [id number] Date of Birth/Sex: 10/09/1995 (22 y.o. F) Treating RN: Curtis Sites Primary Care Provider: Arville Care Other Clinician: Referring Provider: Arville Care Treating Provider/Extender: Linwood Dibbles, HOYT Weeks in Treatment: 1 History of Present Illness HPI Description: 11/03/17 on evaluation today patient presents for initial evaluation concerning two wounds on each the lateral aspect of her abdominal C-section incision. This surgery was actually performed January 2019 and apparently went fairly well although the reason for the C-section was secondary to arrest of dilation at 9 cm. Fortunately delivery went well but unfortunately her postpartum course is complicated by cholecystitis requiring emergent gallbladder removal. Per the patient the surgeon who performed the surgery stated that "this was the worst gallbladder they had ever seen" subsequently following that surgery she was also doing well. On 20 January when she was  seen for a postpartum visit incision looked excellent with no evidence of opening at that point. However on August 23, 2017 she presented back to the OB/GYN office due to a small opening that had occurred at the incision site. There was no fever, chills, she had no significant bleeding and no, pain. With that being said I did agree with the note which was reviewed although I did review multiple notes where it was stated that this was an unusual course and that it appears ace aroma developed but did not show itself until three months after the initial surgery. Unfortunately following that August 23, 2017 visit the patient subsequently goes through several visits which I did review the records of as well where she basically had one side which would open and then that would be treated the other side was closed and then the opposite will happen with the opposite side opening in the initial side closing. This continued until things appear to be almost completely sealed at the end of May 2019 only to unfortunately completely open more significantly on both sides at which point she was referred to wound care for further evaluation and treatment. This is the state in which I see her today upon my initial evaluation. There does not appear to be any fevers, chills, nausea, vomiting, or diarrhea. The patient really has no major medical problems other than the wound which is given her enough trouble she states. She does take Tylenol as needed she is bottlefeeding at this point and she also takes protonix 20 mg daily. Overall she does have some discomfort but mainly because cleansing or touching wound location. 11/10/17 on evaluation today patient appears to actually be doing a little bit worse in regard to her abdominal ulcer in my pinion. This seems to be a little bit deeper than was previously noted during the last evaluation which has me somewhat concerned. Especially with the abnormal history as far as the  presentation and how  this developed 2-3 months after the original surgery. Nonetheless I'm not sure exactly what is going on I'm concerned about the possibility of a fistula. This was discussed with patient and her mother today I believe we may want to have her see a surgeon to discuss any further evaluation in this regard. Electronic Signature(s) Signed: 11/11/2017 12:34:34 PM By: Lenda KelpStone III, Hoyt PA-C Entered By: Lenda KelpStone III, Hoyt on 11/11/2017 12:13:59 Theresa Hansen, Theresa M. ([id number]) -------------------------------------------------------------------------------- Physical Exam Details Patient Name: Theresa Hansen, Theresa M. Date of Service: 11/10/2017 2:00 PM Medical Record Number: [id number] Patient Account Number: [id number] Date of Birth/Sex: 1995-10-27 (22 y.o. F) Treating RN: Curtis Sitesorthy, Joanna Primary Care Provider: Arville CareETTINGER, JOSHUA Other Clinician: Referring Provider: Arville CareETTINGER, JOSHUA Treating Provider/Extender: STONE III, HOYT Weeks in Treatment: 1 Constitutional Well-nourished and well-hydrated in no acute distress. Respiratory normal breathing without difficulty. clear to auscultation bilaterally. Cardiovascular regular rate and rhythm with normal S1, S2. Psychiatric this patient is able to make decisions and demonstrates good insight into disease process. Alert and Oriented x 3. pleasant and cooperative. Notes Patient's wound as mentioned above actually show signs of being a little bit larger compared to previous evaluation and least in regard to the depth. This is the main thing that has me somewhat concerned in that previous it just seemed that it flowed straight across along the incision site this seems like it might be something a little bit deeper than that. Nonetheless she could be a candidate for a snap vac although she may also need further evaluation by a general surgeon to ensure there is no fistula or other abnormality going on just due to the odd nature of how this  progressed. Electronic Signature(s) Signed: 11/11/2017 12:34:34 PM By: Lenda KelpStone III, Hoyt PA-C Entered By: Lenda KelpStone III, Hoyt on 11/11/2017 12:16:30 Theresa Hansen, Theresa M. ([id number]) -------------------------------------------------------------------------------- Physician Orders Details Patient Name: Theresa Hansen, Annalina M. Date of Service: 11/10/2017 2:00 PM Medical Record Number: [id number] Patient Account Number: [id number] Date of Birth/Sex: 1995-10-27 (22 y.o. F) Treating RN: Curtis Sitesorthy, Joanna Primary Care Provider: Arville CareETTINGER, JOSHUA Other Clinician: Referring Provider: Arville CareETTINGER, JOSHUA Treating Provider/Extender: Linwood DibblesSTONE III, HOYT Weeks in Treatment: 1 Verbal / Phone Orders: No Diagnosis Coding ICD-10 Coding Code Description T81.31XA Disruption of external operation (surgical) wound, not elsewhere classified, initial encounter L98.492 Non-pressure chronic ulcer of skin of other sites with fat layer exposed Wound Cleansing Wound #1 Right Abdomen - Lower Quadrant o Clean wound with Normal Saline. o May Shower, gently pat wound dry prior to applying new dressing. Wound #2 Left Abdomen - Lower Quadrant o Clean wound with Normal Saline. o May Shower, gently pat wound dry prior to applying new dressing. Primary Wound Dressing Wound #1 Right Abdomen - Lower Quadrant o Silver Collagen o Iodoform packing Gauze - 1/4 inch Wound #2 Left Abdomen - Lower Quadrant o Silver Collagen o Iodoform packing Gauze - 1/4 inch Secondary Dressing Wound #1 Right Abdomen - Lower Quadrant o Boardered Foam Dressing Wound #2 Left Abdomen - Lower Quadrant o Boardered Foam Dressing Dressing Change Frequency Wound #1 Right Abdomen - Lower Quadrant o Change dressing every day. Wound #2 Left Abdomen - Lower Quadrant o Change dressing every day. Follow-up Appointments Wound #1 Right Abdomen - Lower Quadrant o Return Appointment in 1 week. Wound #2 Left Abdomen - Lower Quadrant o Return  Appointment in 1 week. Theresa Hansen, Theresa Hansen .. ([id number]) Additional Orders / Instructions Wound #1 Right Abdomen - Lower Quadrant o Vitamin A; Vitamin C, Zinc - PLEASE BE SURE YOU ARE TAKING A MULTIVITAMIN WITH VITAMIN  A, VITAMIN C AND ZINC o Increase protein intake. o Activity as tolerated Wound #2 Left Abdomen - Lower Quadrant o Vitamin A; Vitamin C, Zinc - PLEASE BE SURE YOU ARE TAKING A MULTIVITAMIN WITH VITAMIN A, VITAMIN C AND ZINC o Increase protein intake. o Activity as tolerated Consults o General Surgery Notes SNAP [facility] auth Electronic Signature(s) Signed: 11/10/2017 4:59:39 PM By: Curtis Sites Signed: 11/11/2017 12:34:34 PM By: Lenda Kelp PA-C Entered By: Curtis Sites on 11/10/2017 16:56:28 Theresa Hansen ([id number]) -------------------------------------------------------------------------------- Problem List Details Patient Name: Theresa Hansen Date of Service: 11/10/2017 2:00 PM Medical Record Number: [id number] Patient Account Number: [id number] Date of Birth/Sex: December 17, 1995 (22 y.o. F) Treating RN: Curtis Sites Primary Care Provider: Arville Care Other Clinician: Referring Provider: Arville Care Treating Provider/Extender: Linwood Dibbles, HOYT Weeks in Treatment: 1 Active Problems ICD-10 Evaluated Encounter Code Description Active Date Today Diagnosis T81.31XA Disruption of external operation (surgical) wound, not 11/06/2017 No Yes elsewhere classified, initial encounter L98.492 Non-pressure chronic ulcer of skin of other sites with fat layer 11/06/2017 No Yes exposed Inactive Problems Resolved Problems Electronic Signature(s) Signed: 11/11/2017 12:34:34 PM By: Lenda Kelp PA-C Entered By: Lenda Kelp on 11/10/2017 14:28:05 Theresa Hansen ([id number]) -------------------------------------------------------------------------------- Progress Note Details Patient Name: Theresa Hansen Date of Service: 11/10/2017  2:00 PM Medical Record Number: [id number] Patient Account Number: [id number] Date of Birth/Sex: September 09, 1995 (22 y.o. F) Treating RN: Curtis Sites Primary Care Provider: Arville Care Other Clinician: Referring Provider: Arville Care Treating Provider/Extender: Linwood Dibbles, HOYT Weeks in Treatment: 1 Subjective Chief Complaint Information obtained from Patient Abdominal ulcer s/p c-section January 2019 with dehiscence April 2019 History of Present Illness (HPI) 11/03/17 on evaluation today patient presents for initial evaluation concerning two wounds on each the lateral aspect of her abdominal C-section incision. This surgery was actually performed January 2019 and apparently went fairly well although the reason for the C-section was secondary to arrest of dilation at 9 cm. Fortunately delivery went well but unfortunately her postpartum course is complicated by cholecystitis requiring emergent gallbladder removal. Per the patient the surgeon who performed the surgery stated that "this was the worst gallbladder they had ever seen" subsequently following that surgery she was also doing well. On 20 January when she was seen for a postpartum visit incision looked excellent with no evidence of opening at that point. However on August 23, 2017 she presented back to the OB/GYN office due to a small opening that had occurred at the incision site. There was no fever, chills, she had no significant bleeding and no, pain. With that being said I did agree with the note which was reviewed although I did review multiple notes where it was stated that this was an unusual course and that it appears ace aroma developed but did not show itself until three months after the initial surgery. Unfortunately following that August 23, 2017 visit the patient subsequently goes through several visits which I did review the records of as well where she basically had one side which would open and then that would be  treated the other side was closed and then the opposite will happen with the opposite side opening in the initial side closing. This continued until things appear to be almost completely sealed at the end of May 2019 only to unfortunately completely open more significantly on both sides at which point she was referred to wound care for further evaluation and treatment. This is the state in which I see her today upon  my initial evaluation. There does not appear to be any fevers, chills, nausea, vomiting, or diarrhea. The patient really has no major medical problems other than the wound which is given her enough trouble she states. She does take Tylenol as needed she is bottlefeeding at this point and she also takes protonix 20 mg daily. Overall she does have some discomfort but mainly because cleansing or touching wound location. 11/10/17 on evaluation today patient appears to actually be doing a little bit worse in regard to her abdominal ulcer in my pinion. This seems to be a little bit deeper than was previously noted during the last evaluation which has me somewhat concerned. Especially with the abnormal history as far as the presentation and how this developed 2-3 months after the original surgery. Nonetheless I'm not sure exactly what is going on I'm concerned about the possibility of a fistula. This was discussed with patient and her mother today I believe we may want to have her see a surgeon to discuss any further evaluation in this regard. Patient History Information obtained from Patient. Family History Diabetes - Paternal Grandparents, Heart Disease - Maternal Grandparents, Hypertension - Maternal Grandparents,Paternal Grandparents,Mother,Father, Seizures - Maternal Grandparents, Stroke - Maternal Grandparents,Paternal Grandparents, Thyroid Problems - Paternal Grandparents,Maternal Grandparents, No family history of Cancer, Kidney Disease, Lung Disease, Tuberculosis. Social  History Never smoker, Marital Status - Single, Alcohol Use - Never, Drug Use - No History, Caffeine Use - Daily. Review of Systems (ROS) Theresa Hansen, Theresa Hansen. ([id number]) Constitutional Symptoms (General Health) Denies complaints or symptoms of Fever, Chills. Respiratory The patient has no complaints or symptoms. Cardiovascular The patient has no complaints or symptoms. Psychiatric The patient has no complaints or symptoms. Objective Constitutional Well-nourished and well-hydrated in no acute distress. Vitals Time Taken: 2:30 PM, Height: 64 in, Weight: 149 lbs, BMI: 25.6, Temperature: 98.7 F, Pulse: 90 bpm, Respiratory Rate: 16 breaths/min, Blood Pressure: 130/75 mmHg. Respiratory normal breathing without difficulty. clear to auscultation bilaterally. Cardiovascular regular rate and rhythm with normal S1, S2. Psychiatric this patient is able to make decisions and demonstrates good insight into disease process. Alert and Oriented x 3. pleasant and cooperative. General Notes: Patient's wound as mentioned above actually show signs of being a little bit larger compared to previous evaluation and least in regard to the depth. This is the main thing that has me somewhat concerned in that previous it just seemed that it flowed straight across along the incision site this seems like it might be something a little bit deeper than that. Nonetheless she could be a candidate for a snap vac although she may also need further evaluation by a general surgeon to ensure there is no fistula or other abnormality going on just due to the odd nature of how this progressed. Integumentary (Hair, Skin) Wound #1 status is Open. Original cause of wound was Surgical Injury. The wound is located on the Right Abdomen - Lower Quadrant. The wound measures 0.6cm length x 2.6cm width x 1cm depth; 1.225cm^2 area and 1.225cm^3 volume. There is no tunneling or undermining noted. There is a large amount of serosanguineous  drainage noted. The wound margin is distinct with the outline attached to the wound base. There is large (67-100%) red, pink granulation within the wound bed. There is a small (1-33%) amount of necrotic tissue within the wound bed including Adherent Slough. The periwound skin appearance did not exhibit: Callus, Crepitus, Excoriation, Induration, Rash, Scarring, Dry/Scaly, Maceration, Atrophie Blanche, Cyanosis, Ecchymosis, Hemosiderin Staining, Mottled, Pallor, Rubor, Erythema. Wound #  2 status is Open. Original cause of wound was Surgical Injury. The wound is located on the Left Abdomen - Lower Quadrant. The wound measures 0.3cm length x 0.6cm width x 1.6cm depth; 0.141cm^2 area and 0.226cm^3 volume. There is Fat Layer (Subcutaneous Tissue) Exposed exposed. There is no undermining noted, however, there is tunneling at 12:00 with a maximum distance of 1.6cm. There is a large amount of serosanguineous drainage noted. The wound margin is indistinct and nonvisible. There is large (67-100%) red, pink granulation within the wound bed. There is a small (1-33%) amount of necrotic tissue within the wound bed. The periwound skin appearance did not exhibit: Callus, Crepitus, Excoriation, Induration, Rash, Scarring, Dry/Scaly, Maceration, Atrophie Blanche, Cyanosis, Ecchymosis, Hemosiderin Staining, Mottled, Schroeck, Karisma M. ([id number]) Pallor, Rubor, Erythema. Periwound temperature was noted as No Abnormality. Assessment Active Problems ICD-10 Disruption of external operation (surgical) wound, not elsewhere classified, initial encounter Non-pressure chronic ulcer of skin of other sites with fat layer exposed Plan Wound Cleansing: Wound #1 Right Abdomen - Lower Quadrant: Clean wound with Normal Saline. May Shower, gently pat wound dry prior to applying new dressing. Wound #2 Left Abdomen - Lower Quadrant: Clean wound with Normal Saline. May Shower, gently pat wound dry prior to applying new  dressing. Primary Wound Dressing: Wound #1 Right Abdomen - Lower Quadrant: Silver Collagen Iodoform packing Gauze - 1/4 inch Wound #2 Left Abdomen - Lower Quadrant: Silver Collagen Iodoform packing Gauze - 1/4 inch Secondary Dressing: Wound #1 Right Abdomen - Lower Quadrant: Boardered Foam Dressing Wound #2 Left Abdomen - Lower Quadrant: Boardered Foam Dressing Dressing Change Frequency: Wound #1 Right Abdomen - Lower Quadrant: Change dressing every day. Wound #2 Left Abdomen - Lower Quadrant: Change dressing every day. Follow-up Appointments: Wound #1 Right Abdomen - Lower Quadrant: Return Appointment in 1 week. Wound #2 Left Abdomen - Lower Quadrant: Return Appointment in 1 week. Additional Orders / Instructions: Wound #1 Right Abdomen - Lower Quadrant: Vitamin A; Vitamin C, Zinc - PLEASE BE SURE YOU ARE TAKING A MULTIVITAMIN WITH VITAMIN A, VITAMIN C AND ZINC Increase protein intake. Activity as tolerated Wound #2 Left Abdomen - Lower Quadrant: Vitamin A; Vitamin C, Zinc - PLEASE BE SURE YOU ARE TAKING A MULTIVITAMIN WITH VITAMIN A, VITAMIN C AND ZINC Theresa Hansen, Theresa M. ([id number]) Increase protein intake. Activity as tolerated Consults ordered were: General Surgery General Notes: SNAP VAC auth I am going to suggest at this point in time that we go ahead and initiate the above wound care orders for the next week keeping things basically the same. I'm gonna recommend that you still a referral to general surgery we will also get approval as far as a snap vac is concerned. Obviously we do have some options I just want to make sure we're doing the right thing for the patient and the patient and her daughter mother were both informed of our recommendations during the evaluation today. We will see her for reevaluation otherwise to see were things stand next week. Please see above for specific wound care orders. We will see patient for re-evaluation in 1 week(s) here in the  clinic. If anything worsens or changes patient will contact our office for additional recommendations. Electronic Signature(s) Signed: 11/11/2017 12:34:34 PM By: Lenda Kelp PA-C Entered By: Lenda Kelp on 11/11/2017 12:17:29 Theresa Hansen ([id number]) -------------------------------------------------------------------------------- ROS/PFSH Details Patient Name: Theresa Hansen Date of Service: 11/10/2017 2:00 PM Medical Record Number: [id number] Patient Account Number: [id number] Date of Birth/Sex: 1995-07-09 (22 y.o. F)  Treating RN: Curtis Sites Primary Care Provider: Arville Care Other Clinician: Referring Provider: Arville Care Treating Provider/Extender: Linwood Dibbles, HOYT Weeks in Treatment: 1 Information Obtained From Patient Wound History Do you currently have one or more open woundso Yes How many open wounds do you currently haveo 2 Approximately how long have you had your woundso 2 months How have you been treating your wound(s) until nowo packing with wet to dry Has your wound(s) ever healed and then re-openedo No Have you had any lab work done in the past montho No Have you tested positive for an antibiotic resistant organism (MRSA, VRE)o No Have you tested positive for osteomyelitis (bone infection)o No Have you had any tests for circulation on your legso No Constitutional Symptoms (General Health) Complaints and Symptoms: Negative for: Fever; Chills Eyes Medical History: Negative for: Cataracts; Glaucoma; Optic Neuritis Ear/Nose/Mouth/Throat Medical History: Negative for: Chronic sinus problems/congestion; Middle ear problems Hematologic/Lymphatic Medical History: Negative for: Anemia; Hemophilia; Human Immunodeficiency Virus; Lymphedema; Sickle Cell Disease Respiratory Complaints and Symptoms: No Complaints or Symptoms Medical History: Negative for: Aspiration; Asthma; Chronic Obstructive Pulmonary Disease (COPD); Pneumothorax; Sleep  Apnea; Tuberculosis Cardiovascular Complaints and Symptoms: No Complaints or Symptoms Medical History: Negative for: Angina; Arrhythmia; Congestive Heart Failure; Coronary Artery Disease; Deep Vein Thrombosis; Hypertension; Hypotension; Myocardial Infarction; Peripheral Arterial Disease; Peripheral Venous Disease; Phlebitis; Theresa Hansen, Theresa Hansen ([id number]) Vasculitis Gastrointestinal Medical History: Negative for: Cirrhosis ; Colitis; Crohnos; Hepatitis A; Hepatitis B; Hepatitis C Endocrine Medical History: Negative for: Type I Diabetes; Type II Diabetes Genitourinary Medical History: Negative for: End Stage Renal Disease Immunological Medical History: Negative for: Lupus Erythematosus; Raynaudos; Scleroderma Integumentary (Skin) Medical History: Negative for: History of Burn; History of pressure wounds Musculoskeletal Medical History: Negative for: Gout; Rheumatoid Arthritis; Osteoarthritis; Osteomyelitis Neurologic Medical History: Negative for: Dementia; Neuropathy; Quadriplegia; Seizure Disorder Psychiatric Complaints and Symptoms: No Complaints or Symptoms Medical History: Negative for: Anorexia/bulimia; Confinement Anxiety Immunizations Pneumococcal Vaccine: Received Pneumococcal Vaccination: No Implantable Devices Family and Social History Cancer: No; Diabetes: Yes - Paternal Grandparents; Heart Disease: Yes - Maternal Grandparents; Hypertension: Yes - Maternal Grandparents,Paternal Grandparents,Mother,Father; Kidney Disease: No; Lung Disease: No; Seizures: Yes - Maternal Grandparents; Stroke: Yes - Maternal Grandparents,Paternal Grandparents; Thyroid Problems: Yes - Paternal Grandparents,Maternal Grandparents; Tuberculosis: No; Never smoker; Marital Status - Single; Alcohol Use: Never; Drug Use: No History; Caffeine Use: Daily; Financial Concerns: No; Food, Clothing or Shelter Needs: No; Support System Lacking: No; Transportation Concerns: No; Advanced Directives:  No; Patient does not want information on Advanced Directives; Do not resuscitate: No; Living Will: No; Medical Power of Attorney: No Theresa Hansen, Theresa Hansen ([id number]) Physician Affirmation I have reviewed and agree with the above information. Electronic Signature(s) Signed: 11/11/2017 12:34:34 PM By: Lenda Kelp PA-C Signed: 11/13/2017 5:27:38 PM By: Curtis Sites Entered By: Lenda Kelp on 11/11/2017 12:15:32 Theresa Hansen ([id number]) -------------------------------------------------------------------------------- SuperBill Details Patient Name: Theresa Hansen Date of Service: 11/10/2017 Medical Record Number: [id number] Patient Account Number: [id number] Date of Birth/Sex: 1996-05-03 (22 y.o. F) Treating RN: Curtis Sites Primary Care Provider: [facility] Other Clinician: Referring Provider: [facility] Treating Provider/Extender: Linwood Dibbles, HOYT Weeks in Treatment: 1 Diagnosis Coding ICD-10 Codes Code Description T81.31XA Disruption of external operation (surgical) wound, not elsewhere classified, initial encounter L98.492 Non-pressure chronic ulcer of skin of other sites with fat layer exposed Facility Procedures CPT4 Code: 62952841 Description: 99213 - WOUND CARE VISIT-LEV 3 EST PT Modifier: Quantity: 1 Physician Procedures CPT4: Description Modifier Quantity Code [id number] 99214 - WC PHYS LEVEL 4 - EST  PT 1 ICD-10 Diagnosis Description T81.31XA Disruption of external operation (surgical) wound, not elsewhere classified, initial encounter L98.492 Non-pressure chronic ulcer of  skin of other sites with fat layer exposed Electronic Signature(s) Signed: 11/11/2017 12:34:34 PM By: Lenda Kelp PA-C Entered By: Lenda Kelp on 11/11/2017 12:17:49

## 2017-11-15 ENCOUNTER — Other Ambulatory Visit (HOSPITAL_BASED_OUTPATIENT_CLINIC_OR_DEPARTMENT_OTHER): Payer: Self-pay | Admitting: Internal Medicine

## 2017-11-15 ENCOUNTER — Encounter: Payer: BLUE CROSS/BLUE SHIELD | Attending: Internal Medicine | Admitting: Internal Medicine

## 2017-11-15 ENCOUNTER — Other Ambulatory Visit
Admission: RE | Admit: 2017-11-15 | Discharge: 2017-11-15 | Disposition: A | Discharge status: Home / Self Care | Payer: BLUE CROSS/BLUE SHIELD | Source: Ambulatory Visit | Attending: Internal Medicine | Admitting: Internal Medicine

## 2017-11-15 DIAGNOSIS — T8131XA Disruption of external operation (surgical) wound, not elsewhere classified, initial encounter: Secondary | ICD-10-CM | POA: Diagnosis not present

## 2017-11-15 DIAGNOSIS — L98492 Non-pressure chronic ulcer of skin of other sites with fat layer exposed: Secondary | ICD-10-CM | POA: Diagnosis not present

## 2017-11-16 NOTE — Progress Notes (Addendum)
MATIA, ZELADA ([id number]) Visit Report for 11/15/2017 Arrival Information Details Patient Name: MACKINZE, CRIADO Date of Service: 11/15/2017 2:30 PM Medical Record Number: [id number] Patient Account Number: [id number] Date of Birth/Sex: 1995/07/22 (22 y.o. F) Treating RN: Phillis Haggis Primary Care Raine Elsass: [facility] Other Clinician: Referring Ved Martos: [facility] Treating Jeslin Bazinet/Extender: Altamese [place] in Treatment: 1 Visit Information History Since Last Visit All ordered tests and consults were completed: No Patient Arrived: Ambulatory Added or deleted any medications: No Arrival Time: 14:24 Any new allergies or adverse reactions: No Accompanied By: mother Had a fall or experienced change in No Transfer Assistance: None activities of daily living that may affect Patient Identification Verified: Yes risk of falls: Secondary Verification Process Completed: Yes Signs or symptoms of abuse/neglect since last visito No Patient Requires Transmission-Based No Hospitalized since last visit: No Precautions: Implantable device outside of the clinic excluding No Patient Has Alerts: No cellular tissue based products placed in the center since last visit: Has Dressing in Place as Prescribed: Yes Pain Present Now: No Electronic Signature(s) Signed: 11/15/2017 3:49:29 PM By: Alejandro Mulling Entered By: Alejandro Mulling on 11/15/2017 14:24:36 [name] ([id number]) -------------------------------------------------------------------------------- Clinic Level of Care Assessment Details Patient Name: [name] Date of Service: 11/15/2017 2:30 PM Medical Record Number: [id number] Patient Account Number: [id number] Date of Birth/Sex: 1995/09/16 (22 y.o. F) Treating RN: Huel Coventry Primary Care Ladrea Holladay: [facility] Other Clinician: Referring Fergus Throne: [facility] Treating Taivon Haroon/Extender: Altamese [place] in Treatment:  1 Clinic Level of Care Assessment Items TOOL 4 Quantity Score []  - Use when only an EandM is performed on FOLLOW-UP visit 0 ASSESSMENTS - Nursing Assessment / Reassessment []  - Reassessment of Co-morbidities (includes updates in patient status) 0 X- 1 5 Reassessment of Adherence to Treatment Plan ASSESSMENTS - Wound and Skin Assessment / Reassessment []  - Simple Wound Assessment / Reassessment - one wound 0 X- 1 5 Complex Wound Assessment / Reassessment - multiple wounds []  - 0 Dermatologic / Skin Assessment (not related to wound area) ASSESSMENTS - Focused Assessment []  - Circumferential Edema Measurements - multi extremities 0 []  - 0 Nutritional Assessment / Counseling / Intervention []  - 0 Lower Extremity Assessment (monofilament, tuning fork, pulses) []  - 0 Peripheral Arterial Disease Assessment (using hand held doppler) ASSESSMENTS - Ostomy and/or Continence Assessment and Care []  - Incontinence Assessment and Management 0 []  - 0 Ostomy Care Assessment and Management (repouching, etc.) PROCESS - Coordination of Care X - Simple Patient / Family Education for ongoing care 1 15 []  - 0 Complex (extensive) Patient / Family Education for ongoing care X- 1 10 Staff obtains Chiropractor, Records, Test Results / Process Orders []  - 0 Staff telephones HHA, Nursing Homes / Clarify orders / etc []  - 0 Routine Transfer to another Facility (non-emergent condition) []  - 0 Routine Hospital Admission (non-emergent condition) []  - 0 New Admissions / Manufacturing engineer / Ordering NPWT, Apligraf, etc. []  - 0 Emergency Hospital Admission (emergent condition) []  - 0 Simple Discharge Coordination VERLAINE, EMBRY. ([id number]) []  - 0 Complex (extensive) Discharge Coordination PROCESS - Special Needs []  - Pediatric / Minor Patient Management 0 []  - 0 Isolation Patient Management []  - 0 Hearing / Language / Visual special needs []  - 0 Assessment of Community assistance  (transportation, D/C planning, etc.) []  - 0 Additional assistance / Altered mentation []  - 0 Support Surface(s) Assessment (bed, cushion, seat, etc.) INTERVENTIONS - Wound Cleansing / Measurement X - Simple Wound Cleansing - one wound 1  5 []  - 0 Complex Wound Cleansing - multiple wounds X- 1 5 Wound Imaging (photographs - any number of wounds) []  - 0 Wound Tracing (instead of photographs) X- 1 5 Simple Wound Measurement - one wound []  - 0 Complex Wound Measurement - multiple wounds INTERVENTIONS - Wound Dressings []  - Small Wound Dressing one or multiple wounds 0 X- 1 15 Medium Wound Dressing one or multiple wounds []  - 0 Large Wound Dressing one or multiple wounds []  - 0 Application of Medications - topical []  - 0 Application of Medications - injection INTERVENTIONS - Miscellaneous []  - External ear exam 0 X- 1 5 Specimen Collection (cultures, biopsies, blood, body fluids, etc.) []  - 0 Specimen(s) / Culture(s) sent or taken to Lab for analysis []  - 0 Patient Transfer (multiple staff / Nurse, adult / Similar devices) []  - 0 Simple Staple / Suture removal (25 or less) []  - 0 Complex Staple / Suture removal (26 or more) []  - 0 Hypo / Hyperglycemic Management (close monitor of Blood Glucose) []  - 0 Ankle / Brachial Index (ABI) - do not check if billed separately X- 1 5 Vital Signs Stephen, Kourtney M. ([id number]) Has the patient been seen at the hospital within the last three years: Yes Total Score: 75 Level Of Care: New/Established - Level 2 Electronic Signature(s) Signed: 11/15/2017 4:43:24 PM By: Elliot Gurney, BSN, RN, CWS, Kim RN, BSN Entered By: Elliot Gurney, BSN, RN, CWS, Kim on 11/15/2017 14:57:48 [name] ([id number]) -------------------------------------------------------------------------------- Encounter Discharge Information Details Patient Name: [name] Date of Service: 11/15/2017 2:30 PM Medical Record Number: [id number] Patient Account Number:  [id number] Date of Birth/Sex: 1995-09-21 (22 y.o. F) Treating RN: Curtis Sites Primary Care Tasheema Perrone: [facility] Other Clinician: Referring Sharlize Hoar: [facility] Treating Jaylah Goodlow/Extender: Altamese [place] in Treatment: 1 Encounter Discharge Information Items Discharge Condition: Stable Ambulatory Status: Ambulatory Discharge Destination: Home Transportation: Private Auto Accompanied By: mother Schedule Follow-up Appointment: Yes Clinical Summary of Care: Electronic Signature(s) Signed: 11/15/2017 3:52:26 PM By: Curtis Sites Entered By: Curtis Sites on 11/15/2017 15:52:26 [name] ([id number]) -------------------------------------------------------------------------------- Lower Extremity Assessment Details Patient Name: [name] Date of Service: 11/15/2017 2:30 PM Medical Record Number: [id number] Patient Account Number: [id number] Date of Birth/Sex: Feb 13, 1996 (22 y.o. F) Treating RN: Phillis Haggis Primary Care Kashlynn Kundert: [facility] Other Clinician: Referring Abbigael Detlefsen: [facility] Treating Mckenze Slone/Extender: Maxwell Caul Weeks in Treatment: 1 Electronic Signature(s) Signed: 11/15/2017 3:49:29 PM By: Alejandro Mulling Entered By: Alejandro Mulling on 11/15/2017 14:38:00 [name] ([id number]) -------------------------------------------------------------------------------- Multi Wound Chart Details Patient Name: [name] Date of Service: 11/15/2017 2:30 PM Medical Record Number: [id number] Patient Account Number: [id number] Date of Birth/Sex: 03-16-1996 (22 y.o. F) Treating RN: Huel Coventry Primary Care Jeptha Hinnenkamp: [facility] Other Clinician: Referring Kailan Carmen: [facility] Treating Roxine Whittinghill/Extender: Maxwell Caul Weeks in Treatment: 1 Vital Signs Height(in): 64 Pulse(bpm): 96 Weight(lbs): 149 Blood Pressure(mmHg): 112/73 Body Mass Index(BMI): 26 Temperature(F): 98.2 Respiratory  Rate 18 (breaths/min): Photos: [N/A:N/A] Wound Location: Right Abdomen - Lower Left Abdomen - Lower N/A Quadrant Quadrant Wounding Event: Surgical Injury Surgical Injury N/A Primary Etiology: Dehisced Wound Dehisced Wound N/A Date Acquired: 09/03/2017 09/03/2017 N/A Weeks of Treatment: 1 1 N/A Wound Status: Open Open N/A Measurements L x W x D 0.8x2.4x1.2 0.3x0.5x0.7 N/A (cm) Area (cm) : 1.508 0.118 N/A Volume (cm) : 1.81 0.082 N/A % Reduction in Area: 26.20% -7.30% N/A % Reduction in Volume: 31.80% -24.20% N/A Position 1 (o'clock): 3 Maximum Distance 1 (cm): 2.7 Starting Position  1 7 (o'clock): Ending Position 1 5 (o'clock): Maximum Distance 1 (cm): 0.5 Tunneling: Yes No N/A Undermining: No Yes N/A Classification: Full Thickness Without Full Thickness Without N/A Exposed Support Structures Exposed Support Structures Exudate Amount: Large Large N/A Exudate Type: Purulent Purulent N/A Exudate Color: yellow, brown, green yellow, brown, green N/A Wound Margin: Distinct, outline attached Indistinct, nonvisible N/A Granulation Amount: Large (67-100%) Large (67-100%) N/A Karner, Jennea M. ([id number]) Granulation Quality: Red, Pink Red, Pink N/A Necrotic Amount: Small (1-33%) Small (1-33%) N/A Exposed Structures: Fascia: No Fat Layer (Subcutaneous N/A Fat Layer (Subcutaneous Tissue) Exposed: Yes Tissue) Exposed: No Fascia: No Tendon: No Tendon: No Muscle: No Muscle: No Joint: No Joint: No Bone: No Bone: No Epithelialization: None None N/A Periwound Skin Texture: Excoriation: No Excoriation: No N/A Induration: No Induration: No Callus: No Callus: No Crepitus: No Crepitus: No Rash: No Rash: No Scarring: No Scarring: No Periwound Skin Moisture: Maceration: No Maceration: No N/A Dry/Scaly: No Dry/Scaly: No Periwound Skin Color: Atrophie Blanche: No Atrophie Blanche: No N/A Cyanosis: No Cyanosis: No Ecchymosis: No Ecchymosis: No Erythema:  No Erythema: No Hemosiderin Staining: No Hemosiderin Staining: No Mottled: No Mottled: No Pallor: No Pallor: No Rubor: No Rubor: No Temperature: N/A No Abnormality N/A Tenderness on Palpation: No No N/A Wound Preparation: Ulcer Cleansing: Ulcer Cleansing: N/A Rinsed/Irrigated with Saline Rinsed/Irrigated with Saline Topical Anesthetic Applied: Topical Anesthetic Applied: Other: lidocaine % Other: lidocAINE 4% Treatment Notes Wound #1 (Right Abdomen - Lower Quadrant) 1. Cleansed with: Clean wound with Normal Saline 2. Anesthetic Topical Lidocaine 4% cream to wound bed prior to debridement 4. Dressing Applied: Calcium Alginate with Silver 5. Secondary Dressing Applied Bordered Foam Dressing Dry Gauze Notes BFD for drainage and to secure. Wound #2 (Left Abdomen - Lower Quadrant) 1. Cleansed with: Clean wound with Normal Saline 2. Anesthetic Topical Lidocaine 4% cream to wound bed prior to debridement 4. Dressing Applied: Calcium Alginate with Silver NAJE, RICE. ([id number]) 5. Secondary Dressing Applied Bordered Foam Dressing Dry Gauze Notes BFD for drainage and to secure. Electronic Signature(s) Signed: 11/17/2017 8:34:07 AM By: Baltazar Najjar MD Previous Signature: 11/15/2017 4:43:24 PM Version By: Elliot Gurney BSN, RN, CWS, Kim RN, BSN Entered By: Baltazar Najjar on 11/16/2017 14:58:18 [name] ([id number]) -------------------------------------------------------------------------------- Multi-Disciplinary Care Plan Details Patient Name: [name] Date of Service: 11/15/2017 2:30 PM Medical Record Number: [id number] Patient Account Number: [id number] Date of Birth/Sex: [birth year] (22 y.o. F) Treating RN: Huel Coventry Primary Care Kaylany Tesoriero: [facility] Other Clinician: Referring Delaina Fetsch: [facility] Treating Gurneet Matarese/Extender: Altamese [place] in Treatment: 1 Active Inactive ` Orientation to the Wound Care Program Nursing  Diagnoses: Knowledge deficit related to the wound healing center program Goals: Patient/caregiver will verbalize understanding of the Wound Healing Center Program Date Initiated: 11/03/2017 Target Resolution Date: 01/20/2018 Goal Status: Active Interventions: Provide education on orientation to the wound center Notes: ` Wound/Skin Impairment Nursing Diagnoses: Impaired tissue integrity Goals: Ulcer/skin breakdown will heal within 14 weeks Date Initiated: 11/03/2017 Target Resolution Date: 01/20/2018 Goal Status: Active Interventions: Assess patient/caregiver ability to obtain necessary supplies Assess patient/caregiver ability to perform ulcer/skin care regimen upon admission and as needed Assess ulceration(s) every visit Notes: Electronic Signature(s) Signed: 11/15/2017 4:43:24 PM By: Elliot Gurney, BSN, RN, CWS, Kim RN, BSN Entered By: Elliot Gurney, BSN, RN, CWS, Kim on 11/15/2017 14:52:35 [name] ([id number]) -------------------------------------------------------------------------------- Pain Assessment Details Patient Name: [name] Date of Service: 11/15/2017 2:30 PM Medical Record Number: [id number] Patient Account Number: [id number] Date of Birth/Sex: [birth year] (21 y.o.  F) Treating RN: Phillis Haggis Primary Care Puneet Selden: [facility] Other Clinician: Referring Oniyah Rohe: [facility] Treating Jozi Malachi/Extender: Maxwell Caul Weeks in Treatment: 1 Active Problems Location of Pain Severity and Description of Pain Patient Has Paino No Site Locations Pain Management and Medication Current Pain Management: Electronic Signature(s) Signed: 11/15/2017 3:49:29 PM By: Alejandro Mulling Entered By: Alejandro Mulling on 11/15/2017 14:24:42 [name] ([id number]) -------------------------------------------------------------------------------- Patient/Caregiver Education Details Patient Name: [name] Date of Service: 11/15/2017 2:30 PM Medical  Record Number: [id number] Patient Account Number: [id number] Date of Birth/Gender: 01-09-1996 (22 y.o. F) Treating RN: Curtis Sites Primary Care Physician: [facility] Other Clinician: Referring Physician: [facility] Treating Physician/Extender: Altamese [place] in Treatment: 1 Education Assessment Education Provided To: Patient Education Topics Provided Wound/Skin Impairment: Handouts: Other: wound care as ordered Methods: Demonstration, Explain/Verbal Responses: State content correctly Electronic Signature(s) Signed: 11/15/2017 3:55:31 PM By: Curtis Sites Entered By: Curtis Sites on 11/15/2017 15:53:01 [name] ([id number]) -------------------------------------------------------------------------------- Wound Assessment Details Patient Name: [name] Date of Service: 11/15/2017 2:30 PM Medical Record Number: [id number] Patient Account Number: [id number] Date of Birth/Sex: 09-Apr-1996 (22 y.o. F) Treating RN: Phillis Haggis Primary Care Lei Dower: [facility] Other Clinician: Referring Marcelo Ickes: [facility] Treating Saina Waage/Extender: Maxwell Caul Weeks in Treatment: 1 Wound Status Wound Number: 1 Primary Etiology: Dehisced Wound Wound Location: Right Abdomen - Lower Quadrant Wound Status: Open Wounding Event: Surgical Injury Date Acquired: 09/03/2017 Weeks Of Treatment: 1 Clustered Wound: No Photos Photo Uploaded By: Alejandro Mulling on 11/15/2017 15:19:01 Wound Measurements Length: (cm) 0.8 Width: (cm) 2.4 Depth: (cm) 1.2 Area: (cm) 1.508 Volume: (cm) 1.81 % Reduction in Area: 26.2% % Reduction in Volume: 31.8% Epithelialization: None Tunneling: Yes Position (o'clock): 3 Maximum Distance: (cm) 2.7 Undermining: No Wound Description Full Thickness Without Exposed Support Classification: Structures Wound Margin: Distinct, outline attached Exudate Large Amount: Exudate Type: Purulent Exudate  Color: yellow, brown, green Foul Odor After Cleansing: No Slough/Fibrino Yes Wound Bed Granulation Amount: Large (67-100%) Exposed Structure Granulation Quality: Red, Pink Fascia Exposed: No Necrotic Amount: Small (1-33%) Fat Layer (Subcutaneous Tissue) Exposed: No Necrotic Quality: Adherent Slough Tendon Exposed: No Debold, Kiora M. ([id number]) Muscle Exposed: No Joint Exposed: No Bone Exposed: No Periwound Skin Texture Texture Color No Abnormalities Noted: No No Abnormalities Noted: No Callus: No Atrophie Blanche: No Crepitus: No Cyanosis: No Excoriation: No Ecchymosis: No Induration: No Erythema: No Rash: No Hemosiderin Staining: No Scarring: No Mottled: No Pallor: No Moisture Rubor: No No Abnormalities Noted: No Dry / Scaly: No Maceration: No Wound Preparation Ulcer Cleansing: Rinsed/Irrigated with Saline Topical Anesthetic Applied: Other: lidocaine %, Treatment Notes Wound #1 (Right Abdomen - Lower Quadrant) 1. Cleansed with: Clean wound with Normal Saline 2. Anesthetic Topical Lidocaine 4% cream to wound bed prior to debridement 4. Dressing Applied: Calcium Alginate with Silver 5. Secondary Dressing Applied Bordered Foam Dressing Dry Gauze Notes BFD for drainage and to secure. Electronic Signature(s) Signed: 11/15/2017 3:49:29 PM By: Alejandro Mulling Entered By: Alejandro Mulling on 11/15/2017 14:36:14 [name] ([id number]) -------------------------------------------------------------------------------- Wound Assessment Details Patient Name: [name] Date of Service: 11/15/2017 2:30 PM Medical Record Number: [id number] Patient Account Number: [id number] Date of Birth/Sex: Apr 02, 1996 (22 y.o. F) Treating RN: Phillis Haggis Primary Care Willena Jeancharles: [facility] Other Clinician: Referring Ariana Juul: [facility] Treating Sharae Zappulla/Extender: Maxwell Caul Weeks in Treatment: 1 Wound Status Wound Number: 2 Primary  Etiology: Dehisced Wound Wound Location: Left Abdomen - Lower Quadrant Wound Status: Open Wounding Event: Surgical Injury Date Acquired:  09/03/2017 Weeks Of Treatment: 1 Clustered Wound: No Photos Photo Uploaded By: Alejandro MullingPinkerton, Debra on 11/15/2017 15:19:34 Wound Measurements Length: (cm) 0.3 Width: (cm) 0.5 Depth: (cm) 0.7 Area: (cm) 0.118 Volume: (cm) 0.082 % Reduction in Area: -7.3% % Reduction in Volume: -24.2% Epithelialization: None Tunneling: No Undermining: Yes Starting Position (o'clock): 7 Ending Position (o'clock): 5 Maximum Distance: (cm) 0.5 Wound Description Full Thickness Without Exposed Support Classification: Structures Wound Margin: Indistinct, nonvisible Exudate Large Amount: Exudate Type: Purulent Exudate Color: yellow, brown, green Foul Odor After Cleansing: No Slough/Fibrino No Wound Bed Granulation Amount: Large (67-100%) Exposed Structure Granulation Quality: Red, Pink Fascia Exposed: No Necrotic Amount: Small (1-33%) Fat Layer (Subcutaneous Tissue) Exposed: Yes Burklow, Nour Judie PetitM. ([id number]) Tendon Exposed: No Muscle Exposed: No Joint Exposed: No Bone Exposed: No Periwound Skin Texture Texture Color No Abnormalities Noted: No No Abnormalities Noted: No Callus: No Atrophie Blanche: No Crepitus: No Cyanosis: No Excoriation: No Ecchymosis: No Induration: No Erythema: No Rash: No Hemosiderin Staining: No Scarring: No Mottled: No Pallor: No Moisture Rubor: No No Abnormalities Noted: No Dry / Scaly: No Temperature / Pain Maceration: No Temperature: No Abnormality Wound Preparation Ulcer Cleansing: Rinsed/Irrigated with Saline Topical Anesthetic Applied: Other: lidocAINE 4%, Treatment Notes Wound #2 (Left Abdomen - Lower Quadrant) 1. Cleansed with: Clean wound with Normal Saline 2. Anesthetic Topical Lidocaine 4% cream to wound bed prior to debridement 4. Dressing Applied: Calcium Alginate with Silver 5. Secondary  Dressing Applied Bordered Foam Dressing Dry Gauze Notes BFD for drainage and to secure. Electronic Signature(s) Signed: 11/15/2017 3:49:29 PM By: Alejandro MullingPinkerton, Debra Entered By: Alejandro MullingPinkerton, Debra on 11/15/2017 14:37:44 [name], Adreana M. ([id number]) -------------------------------------------------------------------------------- Vitals Details Patient Name: [name], Alane M. Date of Service: 11/15/2017 2:30 PM Medical Record Number: [id number] Patient Account Number: [id number] Date of Birth/Sex: March 02, 1996 (22 y.o. F) Treating RN: Phillis HaggisPinkerton, Debi Primary Care Katheline Brendlinger: Arville CareETTINGER, JOSHUA Other Clinician: Referring Wileen Duncanson: Arville CareETTINGER, JOSHUA Treating Zahava Quant/Extender: Altamese CarolinaOBSON, MICHAEL G Weeks in Treatment: 1 Vital Signs Time Taken: 14:24 Temperature (F): 98.2 Height (in): 64 Pulse (bpm): 96 Weight (lbs): 149 Respiratory Rate (breaths/min): 18 Body Mass Index (BMI): 25.6 Blood Pressure (mmHg): 112/73 Reference Range: 80 - 120 mg / dl Electronic Signature(s) Signed: 11/15/2017 3:49:29 PM By: Alejandro MullingPinkerton, Debra Entered By: Alejandro MullingPinkerton, Debra on 11/15/2017 14:26:27

## 2017-11-17 ENCOUNTER — Other Ambulatory Visit (HOSPITAL_BASED_OUTPATIENT_CLINIC_OR_DEPARTMENT_OTHER): Payer: Self-pay | Admitting: Internal Medicine

## 2017-11-17 ENCOUNTER — Ambulatory Visit
Admission: RE | Admit: 2017-11-17 | Discharge: 2017-11-17 | Disposition: A | Discharge status: Home / Self Care | Payer: BLUE CROSS/BLUE SHIELD | Source: Ambulatory Visit | Attending: Internal Medicine | Admitting: Internal Medicine

## 2017-11-17 DIAGNOSIS — T8131XA Disruption of external operation (surgical) wound, not elsewhere classified, initial encounter: Secondary | ICD-10-CM | POA: Insufficient documentation

## 2017-11-17 DIAGNOSIS — K469 Unspecified abdominal hernia without obstruction or gangrene: Secondary | ICD-10-CM | POA: Diagnosis not present

## 2017-11-17 DIAGNOSIS — K439 Ventral hernia without obstruction or gangrene: Secondary | ICD-10-CM | POA: Diagnosis not present

## 2017-11-17 NOTE — Progress Notes (Signed)
Theresa Hansen ([id number]) Visit Report for 11/15/2017 HPI Details Patient Name: Theresa Hansen Date of Service: 11/15/2017 2:30 PM Medical Record Number: [id number] Patient Account Number: [id number] Date of Birth/Sex: 1995/07/17 (21 y.o. F) Treating RN: Huel Coventry Primary Care Provider: Arville Care Other Clinician: Referring Provider: Arville Care Treating Provider/Extender: Maxwell Caul Weeks in Treatment: 1 History of Present Illness HPI Description: 11/03/17 on evaluation today patient presents for initial evaluation concerning two wounds on each the lateral aspect of her abdominal C-section incision. This surgery was actually performed January 2019 and apparently went fairly well although the reason for the C-section was secondary to arrest of dilation at 9 cm. Fortunately delivery went well but unfortunately her postpartum course is complicated by cholecystitis requiring emergent gallbladder removal. Per the patient the surgeon who performed the surgery stated that "this was the worst gallbladder they had ever seen" subsequently following that surgery she was also doing well. On 20 January when she was seen for a postpartum visit incision looked excellent with no evidence of opening at that point. However on August 23, 2017 she presented back to the OB/GYN office due to a small opening that had occurred at the incision site. There was no fever, chills, she had no significant bleeding and no, pain. With that being said I did agree with the note which was reviewed although I did review multiple notes where it was stated that this was an unusual course and that it appears ace aroma developed but did not show itself until three months after the initial surgery. Unfortunately following that August 23, 2017 visit the patient subsequently goes through several visits which I did review the records of as well where she basically had one side which would open and then that would  be treated the other side was closed and then the opposite will happen with the opposite side opening in the initial side closing. This continued until things appear to be almost completely sealed at the end of May 2019 only to unfortunately completely open more significantly on both sides at which point she was referred to wound care for further evaluation and treatment. This is the state in which I see her today upon my initial evaluation. There does not appear to be any fevers, chills, nausea, vomiting, or diarrhea. The patient really has no major medical problems other than the wound which is given her enough trouble she states. She does take Tylenol as needed she is bottlefeeding at this point and she also takes protonix 20 mg daily. Overall she does have some discomfort but mainly because cleansing or touching wound location. 11/10/17 on evaluation today patient appears to actually be doing a little bit worse in regard to her abdominal ulcer in my pinion. This seems to be a little bit deeper than was previously noted during the last evaluation which has me somewhat concerned. Especially with the abnormal history as far as the presentation and how this developed 2-3 months after the original surgery. Nonetheless I'm not sure exactly what is going on I'm concerned about the possibility of a fistula. This was discussed with patient and her mother today I believe we may want to have her see a surgeon to discuss any further evaluation in this regard. 11/15/17; patient arrived in my clinic today. I've reviewed our previous notes and essentially agree with a history which I reviewed with the patient today. this was apparently opened 2-3 months after the original surgery.significant original drainage. She does not currently complain  of pain, fever or chills drainage. She states that it wasn't for the wounds she wouldn't really have any symptoms at all. She apparently has a appointment made by her clinic  with an independent general surgeon. Our intake nurse reports that the wounds have deteriorated. Electronic Signature(s) Signed: 11/17/2017 8:34:07 AM By: Baltazar Najjar MD Entered By: Baltazar Najjar on 11/16/2017 15:03:02 Theresa Hansen ([id number]) -------------------------------------------------------------------------------- Physical Exam Details Patient Name: Theresa Hansen Date of Service: 11/15/2017 2:30 PM Medical Record Number: [id number] Patient Account Number: [id number] Date of Birth/Sex: 03-02-1996 (21 y.o. F) Treating RN: Huel Coventry Primary Care Provider: Arville Care Other Clinician: Referring Provider: Arville Care Treating Provider/Extender: Maxwell Caul Weeks in Treatment: 1 Constitutional Sitting or standing Blood Pressure is within target range for patient.. Pulse regular and within target range for patient.Marland Kitchen Respirations regular, non-labored and within target range.. Temperature is normal and within the target range for the patient.Marland Kitchen appears in no distress.. Eyes Conjunctivae have some redness. Mild discharge noted.Marland Kitchen Respiratory Respiratory effort is easy and symmetric bilaterally. Rate is normal at rest and on room air.. Cardiovascular Heart rhythm and rate regular, without murmur or gallop.. Gastrointestinal (GI) Abdomen is soft and non-distended without masses or tenderness. Bowel sounds active in all quadrants.. No liver or spleen enlargement or tenderness.. Integumentary (Hair, Skin) no primary skin issues are seen. Notes wound exam; both of these wounds are at either end of a horizontal C-section surgical incision in the lower pelvis. The larger one is to the right smaller one to the left. The larger area has not a very satisfactory looking wound bed. This undermines towards the midline beyond what I can really measure. Similarly area on the left undermines towards the midline as well. I'm suspicious he is area is actually may connect  although I could not prove that. There is no major drainage no tenderness. Nevertheless I cultured the larger wound anteriorly in the tunneling site. Electronic Signature(s) Signed: 11/17/2017 8:34:07 AM By: Baltazar Najjar MD Entered By: Baltazar Najjar on 11/16/2017 15:06:02 Theresa Hansen ([id number]) -------------------------------------------------------------------------------- Physician Orders Details Patient Name: Theresa Hansen Date of Service: 11/15/2017 2:30 PM Medical Record Number: [id number] Patient Account Number: [id number] Date of Birth/Sex: [birth year] (21 y.o. F) Treating RN: Huel Coventry Primary Care Provider: Arville Care Other Clinician: Referring Provider: Arville Care Treating Provider/Extender: Altamese [place] in Treatment: 1 Verbal / Phone Orders: No Diagnosis Coding Wound Cleansing Wound #1 Right Abdomen - Lower Quadrant o Clean wound with Normal Saline. o May Shower, gently pat wound dry prior to applying new dressing. Wound #2 Left Abdomen - Lower Quadrant o Clean wound with Normal Saline. o May Shower, gently pat wound dry prior to applying new dressing. Primary Wound Dressing Wound #1 Right Abdomen - Lower Quadrant o Silver Alginate - Rope Wound #2 Left Abdomen - Lower Quadrant o Silver Alginate - Rope Secondary Dressing Wound #1 Right Abdomen - Lower Quadrant o Boardered Foam Dressing Wound #2 Left Abdomen - Lower Quadrant o Boardered Foam Dressing Dressing Change Frequency Wound #1 Right Abdomen - Lower Quadrant o Change dressing every day. Wound #2 Left Abdomen - Lower Quadrant o Change dressing every day. Follow-up Appointments Wound #1 Right Abdomen - Lower Quadrant o Return Appointment in 1 week. Wound #2 Left Abdomen - Lower Quadrant o Return Appointment in 1 week. Additional Orders / Instructions Wound #2 Left Abdomen - Lower Quadrant o Activity as tolerated Laboratory Theresa Hansen, Theresa Hansen. ([id number]) o Bacteria identified in Wound by Culture (MICRO) -  Abdomen oooo LOINC Code: [id number] oooo Convenience Name: Wound culture routine [employer] o Ultrasound pelvic - Abdomen Electronic Signature(s) Signed: 11/15/2017 4:43:24 PM By: Elliot Gurney, BSN, RN, CWS, Kim RN, BSN Signed: 11/17/2017 8:34:07 AM By: Baltazar Najjar MD Entered By: Elliot Gurney, BSN, RN, CWS, Kim on 11/15/2017 14:56:14 Theresa Hansen ([id number]) -------------------------------------------------------------------------------- Problem List Details Patient Name: Theresa Hansen Date of Service: 11/15/2017 2:30 PM Medical Record Number: [id number] Patient Account Number: [id number] Date of Birth/Sex: 01/14/1996 (21 y.o. F) Treating RN: Huel Coventry Primary Care Provider: Arville Care Other Clinician: Referring Provider: Arville Care Treating Provider/Extender: Altamese [place] in Treatment: 1 Active Problems ICD-10 Evaluated Encounter Code Description Active Date Today Diagnosis T81.31XA Disruption of external operation (surgical) wound, not 11/06/2017 Yes Yes elsewhere classified, initial encounter Status Complications Interventions No surgical site at the previous cesarean section. 2 wound #1 Silver alginate #2 change areas. 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 Making : Smaller wound on the left  probes towards the other open area. I suspect it would connect. Inactive Problems Resolved Problems Electronic Signature(s) Signed: 11/17/2017 8:34:07 AM By: Baltazar Najjar MD Entered By: Baltazar Najjar on 11/16/2017 14:57:58 Theresa Hansen ([id number]) Theresa Hansen, Theresa Hansen ([id number]) -------------------------------------------------------------------------------- Progress Note Details Patient Name: Theresa Hansen Date of Service: 11/15/2017 2:30 PM Medical Record Number: [id number] Patient Account Number: [id number] Date of Birth/Sex: Oct 03, 1995 (21 y.o. F) Treating RN: Huel Coventry Primary Care Provider: Arville Care Other Clinician: Referring Provider: Arville Care Treating Provider/Extender: Maxwell Caul Weeks in Treatment: 1 Subjective History of Present Illness (HPI) 11/03/17 on evaluation today patient presents for initial evaluation concerning two wounds on each the lateral aspect of her abdominal C-section incision. This surgery was actually performed January 2019 and apparently went fairly well although the reason for the C-section was secondary to arrest of dilation at 9 cm. Fortunately delivery went well but unfortunately her postpartum course is complicated by cholecystitis requiring emergent gallbladder removal. Per the patient the surgeon who performed the surgery stated that "this was the worst gallbladder they had ever seen" subsequently following that surgery she was also doing well. On 20 January when she was seen for a postpartum visit incision looked excellent with no evidence of opening at that point. However on August 23, 2017 she presented back to the OB/GYN office due to a small opening that had occurred at the incision site. There was no fever, chills, she had no significant bleeding and no, pain. With that being said I did agree with the note which was reviewed although I did review multiple notes where it was stated that this was an  unusual course and that it appears ace aroma developed but did not show itself until three months after the initial surgery. Unfortunately following that August 23, 2017 visit the patient subsequently goes through several visits which I did review the records of as well where she basically had one  side which would open and then that would be treated the other side was closed and then the opposite will happen with the opposite side opening in the initial side closing. This continued until things appear to be almost completely sealed at the end of May 2019 only to unfortunately completely open more significantly on both sides at which point she was referred to wound care for further evaluation and treatment. This is the state in which I see her today upon my initial evaluation. There does not appear to be any fevers, chills, nausea, vomiting, or diarrhea. The patient really has no major medical problems other than the wound which is given her enough trouble she states. She does take Tylenol as needed she is bottlefeeding at this point and she also takes protonix 20 mg daily. Overall she does have some discomfort but mainly because cleansing or touching wound location. 11/10/17 on evaluation today patient appears to actually be doing a little bit worse in regard to her abdominal ulcer in my pinion. This seems to be a little bit deeper than was previously noted during the last evaluation which has me somewhat concerned. Especially with the abnormal history as far as the presentation and how this developed 2-3 months after the original surgery. Nonetheless I'm not sure exactly what is going on I'm concerned about the possibility of a fistula. This was discussed with patient and her mother today I believe we may want to have her see a surgeon to discuss any further evaluation in this regard. 11/15/17; patient arrived in my clinic today. I've reviewed our previous notes and essentially agree with a history  which I reviewed with the patient today. this was apparently opened 2-3 months after the original surgery.significant original drainage. She does not currently complain of pain, fever or chills drainage. She states that it wasn't for the wounds she wouldn't really have any symptoms at all. She apparently has a appointment made by her clinic with an independent general surgeon. Our intake nurse reports that the wounds have deteriorated. Objective Constitutional Sitting or standing Blood Pressure is within target range for patient.. Pulse regular and within target range for patient.Marland Kitchen Respirations regular, non-labored and within target range.. Temperature is normal and within the target range for the patient.Theresa Hansen, Theresa Hansen ([id number]) appears in no distress.. Vitals Time Taken: 2:24 PM, Height: 64 in, Weight: 149 lbs, BMI: 25.6, Temperature: 98.2 F, Pulse: 96 bpm, Respiratory Rate: 18 breaths/min, Blood Pressure: 112/73 mmHg. Eyes Conjunctivae have some redness. Mild discharge noted.Marland Kitchen Respiratory Respiratory effort is easy and symmetric bilaterally. Rate is normal at rest and on room air.. Cardiovascular Heart rhythm and rate regular, without murmur or gallop.. Gastrointestinal (GI) Abdomen is soft and non-distended without masses or tenderness. Bowel sounds active in all quadrants.. No liver or spleen enlargement or tenderness.. General Notes: wound exam; both of these wounds are at either end of a horizontal C-section surgical incision in the lower pelvis. The larger one is to the right smaller one to the left. The larger area has not a very satisfactory looking wound bed. This undermines towards the midline beyond what I can really measure. Similarly area on the left undermines towards the midline as well. I'm suspicious he is area is actually may connect although I could not prove that. There is no major drainage no tenderness. Nevertheless I cultured the larger wound anteriorly in  the tunneling site. Integumentary (Hair, Skin) no primary skin issues are seen. Wound #1 status is Open. Original cause of  wound was Surgical Injury. The wound is located on the Right Abdomen - Lower Quadrant. The wound measures 0.8cm length x 2.4cm width x 1.2cm depth; 1.508cm^2 area and 1.81cm^3 volume. There is no undermining noted, however, there is tunneling at 3:00 with a maximum distance of 2.7cm. There is a large amount of purulent drainage noted. The wound margin is distinct with the outline attached to the wound base. There is large (67-100%) red, pink granulation within the wound bed. There is a small (1-33%) amount of necrotic tissue within the wound bed including Adherent Slough. The periwound skin appearance did not exhibit: Callus, Crepitus, Excoriation, Induration, Rash, Scarring, Dry/Scaly, Maceration, Atrophie Blanche, Cyanosis, Ecchymosis, Hemosiderin Staining, Mottled, Pallor, Rubor, Erythema. Wound #2 status is Open. Original cause of wound was Surgical Injury. The wound is located on the Left Abdomen - Lower Quadrant. The wound measures 0.3cm length x 0.5cm width x 0.7cm depth; 0.118cm^2 area and 0.082cm^3 volume. There is Fat Layer (Subcutaneous Tissue) Exposed exposed. There is no tunneling noted, however, there is undermining starting at 7:00 and ending at 5:00 with a maximum distance of 0.5cm. There is a large amount of purulent drainage noted. The wound margin is indistinct and nonvisible. There is large (67-100%) red, pink granulation within the wound bed. There is a small (1-33%) amount of necrotic tissue within the wound bed. The periwound skin appearance did not exhibit: Callus, Crepitus, Excoriation, Induration, Rash, Scarring, Dry/Scaly, Maceration, Atrophie Blanche, Cyanosis, Ecchymosis, Hemosiderin Staining, Mottled, Pallor, Rubor, Erythema. Periwound temperature was noted as No Abnormality. Assessment Active Problems ICD-10 Disruption of external operation  (surgical) wound, not elsewhere classified, initial encounter Non-pressure chronic ulcer of skin of other sites with fat layer exposed Theresa Hansen, Theresa M. ([id number]) Plan Wound Cleansing: Wound #1 Right Abdomen - Lower Quadrant: Clean wound with Normal Saline. May Shower, gently pat wound dry prior to applying new dressing. Wound #2 Left Abdomen - Lower Quadrant: Clean wound with Normal Saline. May Shower, gently pat wound dry prior to applying new dressing. Primary Wound Dressing: Wound #1 Right Abdomen - Lower Quadrant: Silver Alginate - Rope Wound #2 Left Abdomen - Lower Quadrant: Silver Alginate - Rope Secondary Dressing: Wound #1 Right Abdomen - Lower Quadrant: Boardered Foam Dressing Wound #2 Left Abdomen - Lower Quadrant: Boardered Foam Dressing Dressing Change Frequency: Wound #1 Right Abdomen - Lower Quadrant: Change dressing every day. Wound #2 Left Abdomen - Lower Quadrant: Change dressing every day. Follow-up Appointments: Wound #1 Right Abdomen - Lower Quadrant: Return Appointment in 1 week. Wound #2 Left Abdomen - Lower Quadrant: Return Appointment in 1 week. Additional Orders / Instructions: Wound #2 Left Abdomen - Lower Quadrant: Activity as tolerated Laboratory ordered were: Wound culture routine - Abdomen ordered were: Ultrasound pelvic - Abdomen Medical Decision Making Disruption of external operation (surgical) wound, not elsewhere classified, initial encounter 11/06/2017 Status: No change Complications: surgical site at the previous cesarean section. 2 wound areas. Considerable undermining in both of these wounds toward each other I suspect they connect Interventions: #1 Silver alginate #2 swab for CandS #3 we'll need to make sure there is not an underlying collection of fluid with a pelvic ultrasound #4 if there is no inserts from 2 and 3 then she'll need a wound VAC Non-pressure chronic ulcer of skin of other sites with fat layer exposed  11/06/2017 Status: No change Complications: surgical wound site at previous cesarean section Interventions: a lot of necrotic debris on the major open area this probes to the left a long distance I  can't really measure this. Smaller wound on the left probes towards the other open area. I suspect it would connect. #1 change the primary dressing to silver alginate rope packing to both areas. border foam dressing #2 abdominal ultrasound to look for a source of underlying fluid either a seroma or an abscess that might still be draining through these tracks i.e. fistula Witucki, Kyan M. ([id number]) #3 await cultures consider antibiotics if this comes back growing a specific organism. #4 depending on 2 and 3 I think she is going to need a wound VAC. We'll need to back both of these areas to see if we can get the areas to close the #5 the patient [place] the beach [place]. We'll see if we can get the ultrasound done before she leaves. We'll need to counsel her not to go into water. Electronic Signature(s) Signed: 11/17/2017 8:34:07 AM By: Baltazar Najjar MD Entered By: Baltazar Najjar on 11/16/2017 15:08:45 Theresa Hansen ([id number]) -------------------------------------------------------------------------------- SuperBill Details Patient Name: Theresa Hansen Date of Service: 11/15/2017 Medical Record Number: [id number] Patient Account Number: [id number] Date of Birth/Sex: 1996/05/06 (21 y.o. F) Treating RN: Huel Coventry Primary Care Provider: Arville Care Other Clinician: Referring Provider: Arville Care Treating Provider/Extender: Maxwell Caul Weeks in Treatment: 1 Diagnosis Coding ICD-10 Codes Code Description T81.31XA Disruption of external operation (surgical) wound, not elsewhere classified, initial encounter L98.492 Non-pressure chronic ulcer of skin of other sites with fat layer exposed Facility Procedures CPT4 Code: [id number] Description: [phone] - WOUND  CARE VISIT-LEV 2 EST PT Modifier: Quantity: 1 Physician Procedures CPT4: Description Modifier Quantity Code [id number] - WC PHYS LEVEL 4 - EST PT 1 ICD-10 Diagnosis Description T81.31XA Disruption of external operation (surgical) wound, not elsewhere classified, initial encounter L98.492 Non-pressure chronic ulcer of  skin of other sites with fat layer exposed Electronic Signature(s) Signed: 11/17/2017 8:34:07 AM By: Baltazar Najjar MD Entered By: Baltazar Najjar on 11/16/2017 15:09:56

## 2017-11-18 LAB — AEROBIC CULTURE W GRAM STAIN (SUPERFICIAL SPECIMEN)

## 2017-11-18 LAB — AEROBIC CULTURE  (SUPERFICIAL SPECIMEN)

## 2017-11-29 ENCOUNTER — Encounter: Payer: BLUE CROSS/BLUE SHIELD | Admitting: Internal Medicine

## 2017-11-29 DIAGNOSIS — L98492 Non-pressure chronic ulcer of skin of other sites with fat layer exposed: Secondary | ICD-10-CM | POA: Diagnosis not present

## 2017-11-30 NOTE — Progress Notes (Addendum)
Theresa Hansen ([id number]) Visit Report for 11/29/2017 Arrival Information Details Patient Name: Theresa Hansen, Theresa Hansen Date of Service: 11/29/2017 2:30 PM Medical Record Number: [id number] Patient Account Number: [id number] Date of Birth/Sex: 1995-07-30 (22 y.o. F) Treating RN: Rema Jasmine Primary Care Genette Huertas: Arville Care Other Clinician: Referring Perlie Stene: Arville Care Treating Evalynne Locurto/Extender: Altamese [place] in Treatment: 3 Visit Information History Since Last Visit Added or deleted any medications: No Patient Arrived: Ambulatory Any new allergies or adverse reactions: No Arrival Time: 14:42 Had a fall or experienced change in No Accompanied By: family activities of daily living that may affect Transfer Assistance: None risk of falls: Patient Requires Transmission-Based No Signs or symptoms of abuse/neglect since last visito No Precautions: Hospitalized since last visit: No Patient Has Alerts: No Implantable device outside of the clinic excluding No cellular tissue based products placed in the center since last visit: Has Dressing in Place as Prescribed: Yes Pain Present Now: No Electronic Signature(s) Signed: 11/29/2017 4:26:46 PM By: Rema Jasmine Entered By: Rema Jasmine on 11/29/2017 14:45:30 Theresa Hansen ([id number]) -------------------------------------------------------------------------------- Clinic Level of Care Assessment Details Patient Name: Theresa Hansen Date of Service: 11/29/2017 2:30 PM Medical Record Number: [id number] Patient Account Number: [id number] Date of Birth/Sex: 1996-01-09 (22 y.o. F) Treating RN: Renne Crigler Primary Care Harout Scheurich: Arville Care Other Clinician: Referring Jamieka Royle: Arville Care Treating Margurette Brener/Extender: Altamese [place] in Treatment: 3 Clinic Level of Care Assessment Items TOOL 4 Quantity Score X - Use when only an EandM is performed on FOLLOW-UP visit 1 0 ASSESSMENTS - Nursing  Assessment / Reassessment X - Reassessment of Co-morbidities (includes updates in patient status) 1 10 X- 1 5 Reassessment of Adherence to Treatment Plan ASSESSMENTS - Wound and Skin Assessment / Reassessment []  - Simple Wound Assessment / Reassessment - one wound 0 X- 2 5 Complex Wound Assessment / Reassessment - multiple wounds []  - 0 Dermatologic / Skin Assessment (not related to wound area) ASSESSMENTS - Focused Assessment []  - Circumferential Edema Measurements - multi extremities 0 []  - 0 Nutritional Assessment / Counseling / Intervention []  - 0 Lower Extremity Assessment (monofilament, tuning fork, pulses) []  - 0 Peripheral Arterial Disease Assessment (using hand held doppler) ASSESSMENTS - Ostomy and/or Continence Assessment and Care []  - Incontinence Assessment and Management 0 []  - 0 Ostomy Care Assessment and Management (repouching, etc.) PROCESS - Coordination of Care X - Simple Patient / Family Education for ongoing care 1 15 []  - 0 Complex (extensive) Patient / Family Education for ongoing care []  - 0 Staff obtains Chiropractor, Records, Test Results / Process Orders []  - 0 Staff telephones HHA, Nursing Homes / Clarify orders / etc []  - 0 Routine Transfer to another Facility (non-emergent condition) []  - 0 Routine Hospital Admission (non-emergent condition) []  - 0 New Admissions / Manufacturing engineer / Ordering NPWT, Apligraf, etc. []  - 0 Emergency Hospital Admission (emergent condition) []  - 0 Simple Discharge Coordination SHAYDA, KALKA. ([id number]) X- 1 15 Complex (extensive) Discharge Coordination PROCESS - Special Needs []  - Pediatric / Minor Patient Management 0 []  - 0 Isolation Patient Management []  - 0 Hearing / Language / Visual special needs []  - 0 Assessment of Community assistance (transportation, D/C planning, etc.) []  - 0 Additional assistance / Altered mentation []  - 0 Support Surface(s) Assessment (bed, cushion, seat,  etc.) INTERVENTIONS - Wound Cleansing / Measurement X - Simple Wound Cleansing - one wound 1 5 []  - 0 Complex Wound Cleansing - multiple wounds X- 1 5 Wound Imaging (  photographs - any number of wounds) []  - 0 Wound Tracing (instead of photographs) X- 1 5 Simple Wound Measurement - one wound []  - 0 Complex Wound Measurement - multiple wounds INTERVENTIONS - Wound Dressings X - Small Wound Dressing one or multiple wounds 2 10 []  - 0 Medium Wound Dressing one or multiple wounds []  - 0 Large Wound Dressing one or multiple wounds []  - 0 Application of Medications - topical []  - 0 Application of Medications - injection INTERVENTIONS - Miscellaneous []  - External ear exam 0 []  - 0 Specimen Collection (cultures, biopsies, blood, body fluids, etc.) []  - 0 Specimen(s) / Culture(s) sent or taken to Lab for analysis []  - 0 Patient Transfer (multiple staff / Nurse, adult / Similar devices) []  - 0 Simple Staple / Suture removal (25 or less) []  - 0 Complex Staple / Suture removal (26 or more) []  - 0 Hypo / Hyperglycemic Management (close monitor of Blood Glucose) []  - 0 Ankle / Brachial Index (ABI) - do not check if billed separately X- 1 5 Vital Signs Waddill, Aurea M. ([id number]) Has the patient been seen at the hospital within the last three years: Yes Total Score: 95 Level Of Care: New/Established - Level 3 Electronic Signature(s) Signed: 11/29/2017 4:58:57 PM By: Renne Crigler Entered By: Renne Crigler on 11/29/2017 15:20:38 Theresa Hansen ([id number]) -------------------------------------------------------------------------------- Encounter Discharge Information Details Patient Name: Theresa Hansen Date of Service: 11/29/2017 2:30 PM Medical Record Number: [id number] Patient Account Number: [id number] Date of Birth/Sex: January 19, 1996 (22 y.o. F) Treating RN: Curtis Sites Primary Care Taseen Marasigan: Arville Care Other Clinician: Referring Tyra Gural: Arville Care Treating Lyn Joens/Extender: Altamese [place] in Treatment: 3 Encounter Discharge Information Items Discharge Condition: Stable Ambulatory Status: Ambulatory Discharge Destination: Home Transportation: Private Auto Accompanied By: mother Schedule Follow-up Appointment: Yes Clinical Summary of Care: Electronic Signature(s) Signed: 11/29/2017 4:29:11 PM By: Curtis Sites Entered By: Curtis Sites on 11/29/2017 16:29:11 Theresa Hansen ([id number]) -------------------------------------------------------------------------------- Lower Extremity Assessment Details Patient Name: Theresa Hansen Date of Service: 11/29/2017 2:30 PM Medical Record Number: [id number] Patient Account Number: [id number] Date of Birth/Sex: 08-Feb-1996 (21 y.o. F) Treating RN: Rema Jasmine Primary Care Yassmine Tamm: Arville Care Other Clinician: Referring Lavender Stanke: Arville Care Treating Itzamar Traynor/Extender: Maxwell Caul Weeks in Treatment: 3 Electronic Signature(s) Signed: 11/29/2017 4:26:46 PM By: Rema Jasmine Entered By: Rema Jasmine on 11/29/2017 14:48:49 Theresa Hansen ([id number]) -------------------------------------------------------------------------------- Multi Wound Chart Details Patient Name: Theresa Hansen Date of Service: 11/29/2017 2:30 PM Medical Record Number: [id number] Patient Account Number: [id number] Date of Birth/Sex: Jan 08, 1996 (21 y.o. F) Treating RN: Renne Crigler Primary Care Ladanian Kelter: Arville Care Other Clinician: Referring Rosaelena Kemnitz: Arville Care Treating Ifeoma Vallin/Extender: Altamese [place] in Treatment: 3 Vital Signs Height(in): 64 Pulse(bpm): 84 Weight(lbs): 149 Blood Pressure(mmHg): 115/64 Body Mass Index(BMI): 26 Temperature(F): 98.5 Respiratory Rate 16 (breaths/min): Photos: [N/A:N/A] Wound Location: Right Abdomen - Lower Left Abdomen - Lower N/A Quadrant Quadrant Wounding Event: Surgical Injury Surgical Injury  N/A Primary Etiology: Dehisced Wound Dehisced Wound N/A Date Acquired: 09/03/2017 09/03/2017 N/A Weeks of Treatment: 3 3 N/A Wound Status: Open Open N/A Measurements L x W x D 0.8x2.2x1.7 0.3x0.5x0.3 N/A (cm) Area (cm) : 1.382 0.118 N/A Volume (cm) : 2.35 0.035 N/A % Reduction in Area: 32.30% -7.30% N/A % Reduction in Volume: 11.50% 47.00% N/A Starting Position 1 3 9  (o'clock): Ending Position 1 3 9  (o'clock): Maximum Distance 1 (cm): 1.3 1 Undermining: Yes Yes N/A Classification: Full Thickness Without Full Thickness Without N/A Exposed Support Structures  Exposed Support Structures Exudate Amount: Large Large N/A Exudate Type: Purulent Purulent N/A Exudate Color: yellow, brown, green yellow, brown, green N/A Wound Margin: Distinct, outline attached Indistinct, nonvisible N/A Granulation Amount: Large (67-100%) Large (67-100%) N/A Granulation Quality: Red, Pink Red, Pink N/A Necrotic Amount: Small (1-33%) Small (1-33%) N/A Exposed Structures: Fascia: No Fat Layer (Subcutaneous N/A Fat Layer (Subcutaneous Tissue) Exposed: Yes Tissue) Exposed: No Fascia: No Sturte[place], Myrical M. ([id number]) Tendon: No Tendon: No Muscle: No Muscle: No Joint: No Joint: No Bone: No Bone: No Epithelialization: None None N/A Periwound Skin Texture: Excoriation: No Excoriation: No N/A Induration: No Induration: No Callus: No Callus: No Crepitus: No Crepitus: No Rash: No Rash: No Scarring: No Scarring: No Periwound Skin Moisture: Maceration: No Maceration: No N/A Dry/Scaly: No Dry/Scaly: No Periwound Skin Color: Erythema: Yes Erythema: Yes N/A Atrophie Blanche: No Atrophie Blanche: No Cyanosis: No Cyanosis: No Ecchymosis: No Ecchymosis: No Hemosiderin Staining: No Hemosiderin Staining: No Mottled: No Mottled: No Pallor: No Pallor: No Rubor: No Rubor: No Erythema Location: Circumferential Circumferential N/A Temperature: N/A No Abnormality N/A Tenderness on  Palpation: No No N/A Wound Preparation: Ulcer Cleansing: Ulcer Cleansing: N/A Rinsed/Irrigated with Saline Rinsed/Irrigated with Saline Topical Anesthetic Applied: Topical Anesthetic Applied: Other: lidocaine % Other: lidocAINE 4% Treatment Notes Wound #1 (Right Abdomen - Lower Quadrant) 1. Cleansed with: Clean wound with Normal Saline 2. Anesthetic Topical Lidocaine 4% cream to wound bed prior to debridement 4. Dressing Applied: Calcium Alginate with Silver 5. Secondary Dressing Applied Bordered Foam Dressing Dry Gauze Notes BFD for drainage and to secure. Wound #2 (Left Abdomen - Lower Quadrant) 1. Cleansed with: Clean wound with Normal Saline 2. Anesthetic Topical Lidocaine 4% cream to wound bed prior to debridement 4. Dressing Applied: Calcium Alginate with Silver 5. Secondary Dressing Applied Bordered Foam Dressing Dry Gauze Theresa FinnerBULLINS, Jerzie M. ([id number]) Notes BFD for drainage and to secure. Electronic Signature(s) Signed: 12/01/2017 7:56:50 AM By: Baltazar Najjarobson, Michael MD Previous Signature: 11/29/2017 4:58:57 PM Version By: Renne CriglerFlinchum, Cheryl Entered By: Baltazar Najjarobson, Michael on 12/01/2017 07:31:30 Theresa FinnerBULLINS, Greenlee M. ([id number]) -------------------------------------------------------------------------------- Multi-Disciplinary Care Plan Details Patient Name: Theresa FinnerBULLINS, Estoria M. Date of Service: 11/29/2017 2:30 PM Medical Record Number: [id number] Patient Account Number: [id number] Date of Birth/Sex: [birth year] (21 y.o. F) Treating RN: Renne CriglerFlinchum, Cheryl Primary Care Kaislee Chao: Arville CareETTINGER, JOSHUA Other Clinician: Referring Lanisha Stepanian: Arville CareETTINGER, JOSHUA Treating Trudi Morgenthaler/Extender: Altamese CarolinaOBSON, MICHAEL G Weeks in Treatment: 3 Active Inactive ` Orientation to the Wound Care Program Nursing Diagnoses: Knowledge deficit related to the wound healing center program Goals: Patient/caregiver will verbalize understanding of the Wound Healing Center Program Date Initiated: 11/03/2017 Target  Resolution Date: 01/20/2018 Goal Status: Active Interventions: Provide education on orientation to the wound center Notes: ` Wound/Skin Impairment Nursing Diagnoses: Impaired tissue integrity Goals: Ulcer/skin breakdown will heal within 14 weeks Date Initiated: 11/03/2017 Target Resolution Date: 01/20/2018 Goal Status: Active Interventions: Assess patient/caregiver ability to obtain necessary supplies Assess patient/caregiver ability to perform ulcer/skin care regimen upon admission and as needed Assess ulceration(s) every visit Notes: Electronic Signature(s) Signed: 11/29/2017 4:58:57 PM By: Renne CriglerFlinchum, Cheryl Entered By: Renne CriglerFlinchum, Cheryl on 11/29/2017 15:13:44 Theresa FinnerBULLINS, Talayeh M. ([id number]) -------------------------------------------------------------------------------- Pain Assessment Details Patient Name: Theresa FinnerBULLINS, Breeanne M. Date of Service: 11/29/2017 2:30 PM Medical Record Number: [id number] Patient Account Number: [id number] Date of Birth/Sex: [birth year] (21 y.o. F) Treating RN: Rema JasmineNg, Wendi Primary Care Loralai Eisman: Arville CareETTINGER, JOSHUA Other Clinician: Referring Beatrice Ziehm: Arville CareETTINGER, JOSHUA Treating Kaeli Nichelson/Extender: Altamese CarolinaOBSON, MICHAEL G Weeks in Treatment: 3 Active Problems Location of Pain Severity and Description of Pain Patient Has Paino No Site  Locations Pain Management and Medication Current Pain Management: Goals for Pain Management Topical or injectable lidocaine is offered to patient for acute pain when surgical debridement is performed. If needed, Patient is instructed to use over the counter pain medication for the following 24-48 hours after debridement. Wound care MDs do not prescribed pain medications. Patient has chronic pain or uncontrolled pain. Patient has been instructed to make an appointment with their Primary Care Physician for pain management. Electronic Signature(s) Signed: 11/29/2017 4:26:46 PM By: Rema Jasmine Entered By: Rema Jasmine on 11/29/2017  14:48:10 Theresa Hansen ([id number]) -------------------------------------------------------------------------------- Patient/Caregiver Education Details Patient Name: Theresa Hansen Date of Service: 11/29/2017 2:30 PM Medical Record Number: [id number] Patient Account Number: [id number] Date of Birth/Gender: 1995/12/08 (21 y.o. F) Treating RN: Curtis Sites Primary Care Physician: Arville Care Other Clinician: Referring Physician: Arville Care Treating Physician/Extender: Altamese [place] in Treatment: 3 Education Assessment Education Provided To: Patient Education Topics Provided Wound/Skin Impairment: Handouts: Other: wound care as ordered Methods: Demonstration, Explain/Verbal Responses: State content correctly Electronic Signature(s) Signed: 11/29/2017 5:29:35 PM By: Curtis Sites Entered By: Curtis Sites on 11/29/2017 16:29:28 Theresa Hansen ([id number]) -------------------------------------------------------------------------------- Wound Assessment Details Patient Name: Theresa Hansen Date of Service: 11/29/2017 2:30 PM Medical Record Number: [id number] Patient Account Number: [id number] Date of Birth/Sex: Jan 08, 1996 (21 y.o. F) Treating RN: Rema Jasmine Primary Care Marieelena Bartko: Arville Care Other Clinician: Referring Marshawn Ninneman: Arville Care Treating Grayton Lobo/Extender: Maxwell Caul Weeks in Treatment: 3 Wound Status Wound Number: 1 Primary Etiology: Dehisced Wound Wound Location: Right Abdomen - Lower Quadrant Wound Status: Open Wounding Event: Surgical Injury Date Acquired: 09/03/2017 Weeks Of Treatment: 3 Clustered Wound: No Photos Photo Uploaded By: Rema Jasmine on 11/29/2017 15:09:40 Wound Measurements Length: (cm) 0.8 % Reduc Width: (cm) 2.2 % Reduc Depth: (cm) 1.7 Epithel Area: (cm) 1.382 Tunnel Volume: (cm) 2.35 Underm Star Endi Maxi tion in Area: 32.3% tion in Volume: 11.5% ialization: None ing:  No ining: Yes ting Position (o'clock): 3 ng Position (o'clock): 3 mum Distance: (cm) 1.3 Wound Description Full Thickness Without Exposed Support Classification: Structures Wound Margin: Distinct, outline attached Exudate Large Amount: Exudate Type: Purulent Exudate Color: yellow, brown, green Foul Odor After Cleansing: No Slough/Fibrino Yes Wound Bed Granulation Amount: Large (67-100%) Exposed Structure Granulation Quality: Red, Pink Fascia Exposed: No Necrotic Amount: Small (1-33%) Fat Layer (Subcutaneous Tissue) Exposed: No Necrotic Quality: Adherent Slough Tendon Exposed: No Muscle Exposed: No Joint Exposed: No Swindell, Kaede M. ([id number]) Bone Exposed: No Periwound Skin Texture Texture Color No Abnormalities Noted: No No Abnormalities Noted: No Callus: No Atrophie Blanche: No Crepitus: No Cyanosis: No Excoriation: No Ecchymosis: No Induration: No Erythema: Yes Rash: No Erythema Location: Circumferential Scarring: No Hemosiderin Staining: No Mottled: No Moisture Pallor: No No Abnormalities Noted: No Rubor: No Dry / Scaly: No Maceration: No Wound Preparation Ulcer Cleansing: Rinsed/Irrigated with Saline Topical Anesthetic Applied: Other: lidocaine %, Treatment Notes Wound #1 (Right Abdomen - Lower Quadrant) 1. Cleansed with: Clean wound with Normal Saline 2. Anesthetic Topical Lidocaine 4% cream to wound bed prior to debridement 4. Dressing Applied: Calcium Alginate with Silver 5. Secondary Dressing Applied Bordered Foam Dressing Dry Gauze Notes BFD for drainage and to secure. Electronic Signature(s) Signed: 11/29/2017 4:26:46 PM By: Rema Jasmine Entered By: Rema Jasmine on 11/29/2017 14:58:37 Theresa Hansen ([id number]) -------------------------------------------------------------------------------- Wound Assessment Details Patient Name: Theresa Hansen Date of Service: 11/29/2017 2:30 PM Medical Record Number: [id number] Patient  Account Number: [id number] Date of Birth/Sex: 07-Jun-1995 (21 y.o. F) Treating RN: Olena Leatherwood,  Wendi Primary Care Toluwani Ruder: Arville Care Other Clinician: Referring Karee Forge: Arville Care Treating Wendell Fiebig/Extender: Maxwell Caul Weeks in Treatment: 3 Wound Status Wound Number: 2 Primary Etiology: Dehisced Wound Wound Location: Left Abdomen - Lower Quadrant Wound Status: Open Wounding Event: Surgical Injury Date Acquired: 09/03/2017 Weeks Of Treatment: 3 Clustered Wound: No Photos Photo Uploaded By: Rema Jasmine on 11/29/2017 15:09:40 Wound Measurements Length: (cm) 0.3 % Reduc Width: (cm) 0.5 % Reduc Depth: (cm) 0.3 Epithel Area: (cm) 0.118 Tunnel Volume: (cm) 0.035 Underm Star Endi Maxi tion in Area: -7.3% tion in Volume: 47% ialization: None ing: No ining: Yes ting Position (o'clock): 9 ng Position (o'clock): 9 mum Distance: (cm) 1 Wound Description Full Thickness Without Exposed Support Classification: Structures Wound Margin: Indistinct, nonvisible Exudate Large Amount: Exudate Type: Purulent Exudate Color: yellow, brown, green Foul Odor After Cleansing: No Slough/Fibrino No Wound Bed Granulation Amount: Large (67-100%) Exposed Structure Granulation Quality: Red, Pink Fascia Exposed: No Necrotic Amount: Small (1-33%) Fat Layer (Subcutaneous Tissue) Exposed: Yes Tendon Exposed: No Muscle Exposed: No Joint Exposed: No Gaubert, Chastin M. ([id number]) Bone Exposed: No Periwound Skin Texture Texture Color No Abnormalities Noted: No No Abnormalities Noted: No Callus: No Atrophie Blanche: No Crepitus: No Cyanosis: No Excoriation: No Ecchymosis: No Induration: No Erythema: Yes Rash: No Erythema Location: Circumferential Scarring: No Hemosiderin Staining: No Mottled: No Moisture Pallor: No No Abnormalities Noted: No Rubor: No Dry / Scaly: No Maceration: No Temperature / Pain Temperature: No Abnormality Wound Preparation Ulcer Cleansing:  Rinsed/Irrigated with Saline Topical Anesthetic Applied: Other: lidocAINE 4%, Treatment Notes Wound #2 (Left Abdomen - Lower Quadrant) 1. Cleansed with: Clean wound with Normal Saline 2. Anesthetic Topical Lidocaine 4% cream to wound bed prior to debridement 4. Dressing Applied: Calcium Alginate with Silver 5. Secondary Dressing Applied Bordered Foam Dressing Dry Gauze Notes BFD for drainage and to secure. Electronic Signature(s) Signed: 11/29/2017 4:26:46 PM By: Rema Jasmine Entered By: Rema Jasmine on 11/29/2017 15:02:47 Theresa Hansen ([id number]) -------------------------------------------------------------------------------- Vitals Details Patient Name: Theresa Hansen Date of Service: 11/29/2017 2:30 PM Medical Record Number: [id number] Patient Account Number: [id number] Date of Birth/Sex: 04-Feb-1996 (21 y.o. F) Treating RN: Rema Jasmine Primary Care Tadeo Besecker: Arville Care Other Clinician: Referring Thorin Starner: Arville Care Treating Anaiya Wisinski/Extender: Altamese [place] in Treatment: 3 Vital Signs Time Taken: 14:45 Temperature (F): 98.5 Height (in): 64 Pulse (bpm): 84 Weight (lbs): 149 Respiratory Rate (breaths/min): 16 Body Mass Index (BMI): 25.6 Blood Pressure (mmHg): 115/64 Reference Range: 80 - 120 mg / dl Electronic Signature(s) Signed: 11/29/2017 4:26:46 PM By: Rema Jasmine Entered By: Rema Jasmine on 11/29/2017 14:48:37

## 2017-12-02 NOTE — Progress Notes (Signed)
EVELEAN, BIGLER ([id number]) Visit Report for 11/29/2017 HPI Details Patient Name: Theresa Hansen, Theresa Hansen Date of Service: 11/29/2017 2:30 PM Medical Record Number: [id number] Patient Account Number: [id number] Date of Birth/Sex: December 26, 1995 (22 y.o. F) Treating RN: Curtis Sites Primary Care Provider: Arville Care Other Clinician: Referring Provider: Arville Care Treating Provider/Extender: Altamese [place] in Treatment: 3 History of Present Illness HPI Description: 11/03/17 on evaluation today patient presents for initial evaluation concerning two wounds on each the lateral aspect of her abdominal C-section incision. This surgery was actually performed January 2019 and apparently went fairly well although the reason for the C-section was secondary to arrest of dilation at 9 cm. Fortunately delivery went well but unfortunately her postpartum course is complicated by cholecystitis requiring emergent gallbladder removal. Per the patient the surgeon who performed the surgery stated that "this was the worst gallbladder they had ever seen" subsequently following that surgery she was also doing well. On 20 January when she was seen for a postpartum visit incision looked excellent with no evidence of opening at that point. However on August 23, 2017 she presented back to the OB/GYN office due to a small opening that had occurred at the incision site. There was no fever, chills, she had no significant bleeding and no, pain. With that being said I did agree with the note which was reviewed although I did review multiple notes where it was stated that this was an unusual course and that it appears ace aroma developed but did not show itself until three months after the initial surgery. Unfortunately following that August 23, 2017 visit the patient subsequently goes through several visits which I did review the records of as well where she basically had one side which would open and then that  would be treated the other side was closed and then the opposite will happen with the opposite side opening in the initial side closing. This continued until things appear to be almost completely sealed at the end of May 2019 only to unfortunately completely open more significantly on both sides at which point she was referred to wound care for further evaluation and treatment. This is the state in which I see her today upon my initial evaluation. There does not appear to be any fevers, chills, nausea, vomiting, or diarrhea. The patient really has no major medical problems other than the wound which is given her enough trouble she states. She does take Tylenol as needed she is bottlefeeding at this point and she also takes protonix 20 mg daily. Overall she does have some discomfort but mainly because cleansing or touching wound location. 11/10/17 on evaluation today patient appears to actually be doing a little bit worse in regard to her abdominal ulcer in my pinion. This seems to be a little bit deeper than was previously noted during the last evaluation which has me somewhat concerned. Especially with the abnormal history as far as the presentation and how this developed 2-3 months after the original surgery. Nonetheless I'm not sure exactly what is going on I'm concerned about the possibility of a fistula. This was discussed with patient and her mother today I believe we may want to have her see a surgeon to discuss any further evaluation in this regard. 11/15/17; patient arrived in my clinic today. I've reviewed our previous notes and essentially agree with a history which I reviewed with the patient today. this was apparently opened 2-3 months after the original surgery.significant original drainage. She does not currently complain  of pain, fever or chills drainage. She states that it wasn't for the wounds she wouldn't really have any symptoms at all. She apparently has a appointment made by her  clinic with an independent general surgeon. Our intake nurse reports that the wounds have deteriorated. 11/29/17; patient with nonhealing surgical areas on the lower abdominal C-section area. Clearly these areas connect. Ultrasound that I ordered did not show a fluid collection or abscess. Culture showed a few MSSA. I prescribed cephalexin and [place] she only [place] these up yesterday however she is remains systemically well. No fever chills abdominal pain change in bowel habits etc. [facility] will not cover a snap VAC which is something we already should've suspected in any case she'll probably get a standard [employer] VAC Electronic Signature(s) Signed: 12/01/2017 7:56:50 AM By: Baltazar Najjar MD Theresa Hansen ([id number]) Entered By: Baltazar Najjar on 12/01/2017 07:33:09 Theresa Hansen ([id number]) -------------------------------------------------------------------------------- Physical Exam Details Patient Name: Theresa Hansen Date of Service: 11/29/2017 2:30 PM Medical Record Number: [id number] Patient Account Number: [id number] Date of Birth/Sex: 05/15/1996 (22 y.o. F) Treating RN: Curtis Sites Primary Care Provider: Arville Care Other Clinician: Referring Provider: Arville Care Treating Provider/Extender: Maxwell Caul We[place] in Treatment: 3 Constitutional Sitting or standing Blood Pressure is within target range for patient.. Pulse regular and within target range for patient.Marland Kitchen Respirations regular, non-labored and within target range.. Temperature is normal and within the target range for the patient.Marland Kitchen appears in no distress. Eyes Conjunctivae clear. No discharge. Respiratory Respiratory effort is easy and symmetric bilaterally. Rate is normal at rest and on room air.. Gastrointestinal (GI) Nontender no masses. There is no drainage coming out of the lower abdominal wound. No liver or spleen enlargement  or tenderness.. Genitourinary (GU) Bladder without fullness, masses or tenderness.. Integumentary (Hair, Skin) noSystemic skin issues. Psychiatric No evidence of depression, anxiety, or agitation. Calm, cooperative, and communicative. Appropriate interactions and affect.. Notes Wound exam; lower pelvic horizontal C-section scar. Larger one is to the right smaller one to the left. Larger wound has a satisfactory looking wound bed today. This clearly tunnels towards the midline and I was able to show the connection which I think was intuitively obvious. Smaller areas on the left. There is no tenderness around the wound no purulent drainage. Electronic Signature(s) Signed: 12/01/2017 7:56:50 AM By: Baltazar Najjar MD Entered By: Baltazar Najjar on 12/01/2017 07:35:06 Theresa Hansen ([id number]) -------------------------------------------------------------------------------- Physician Orders Details Patient Name: Theresa Hansen Date of Service: 11/29/2017 2:30 PM Medical Record Number: [id number] Patient Account Number: [id number] Date of Birth/Sex: [birth year] (21 y.o. F) Treating RN: Renne Crigler Primary Care Provider: Arville Care Other Clinician: Referring Provider: Arville Care Treating Provider/Extender: Altamese [place] in Treatment: 3 Verbal / Phone Orders: No Diagnosis Coding Wound Cleansing Wound #1 Right Abdomen - Lower Quadrant o Clean wound with Normal Saline. o May Shower, gently pat wound dry prior to applying new dressing. Wound #2 Left Abdomen - Lower Quadrant o Clean wound with Normal Saline. o May Shower, gently pat wound dry prior to applying new dressing. Primary Wound Dressing Wound #1 Right Abdomen - Lower Quadrant o Silver Alginate - Rope Wound #2 Left Abdomen - Lower Quadrant o Silver Alginate - Rope Secondary Dressing Wound #1 Right Abdomen - Lower Quadrant o Boardered Foam Dressing Wound #2 Left Abdomen - Lower  Quadrant o Boardered Foam Dressing Dressing Change Frequency Wound #1 Right Abdomen - Lower Quadrant o Change dressing every day. Wound #  2 Left Abdomen - Lower Quadrant o Change dressing every day. Follow-up Appointments Wound #1 Right Abdomen - Lower Quadrant o Return Appointment in 1 week. Wound #2 Left Abdomen - Lower Quadrant o Return Appointment in 1 week. Additional Orders / Instructions Wound #2 Left Abdomen - Lower Quadrant o Activity as tolerated Negative Pressure Wound Therapy Theresa Hansen, Theresa Hansen ([id number]) Wound #1 Right Abdomen - Lower Quadrant o Other: - order wound vac Wound #2 Left Abdomen - Lower Quadrant o Other: - order wound vac Electronic Signature(s) Signed: 11/29/2017 4:58:57 PM By: Renne Crigler Signed: 12/01/2017 7:56:50 AM By: Baltazar Najjar MD Entered By: Renne Crigler on 11/29/2017 15:19:18 Theresa Hansen ([id number]) -------------------------------------------------------------------------------- Problem List Details Patient Name: Theresa Hansen Date of Service: 11/29/2017 2:30 PM Medical Record Number: [id number] Patient Account Number: [id number] Date of Birth/Sex: 1996-03-16 (21 y.o. F) Treating RN: Curtis Sites Primary Care Provider: Arville Care Other Clinician: Referring Provider: Arville Care Treating Provider/Extender: Altamese [place] in Treatment: 3 Active Problems ICD-10 Evaluated Encounter Code Description Active Date Today Diagnosis T81.31XA Disruption of external operation (surgical) wound, not 11/06/2017 Yes Yes elsewhere classified, initial encounter Status Complications Interventions No surgical site at the previous cesarean section. 2 wound oUltrasound was change areas. Continued undermining in both of these areas and negative for an indeed I was able to show that these connect.Marland Kitchen abscess or fluid Medical collection oCulture Decision for methicillin Making : sensitive  staph aureus she just started antibiotics yesterday L98.492 Non-pressure chronic ulcer of skin of other sites with fat 11/06/2017 Yes Yes layer exposed Status Complications Interventions No surgical wound site at previous cesarean section Ordered a wound change VAC o[employer] will not cover snap Decision vac's. She will likely Making : need a standard presumably [facility] Inactive Problems Resolved Problems Electronic Signature(s) Signed: 12/01/2017 7:56:50 AM By: Baltazar Najjar MD Entered By: Baltazar Najjar on 12/01/2017 07:30:44 Theresa Hansen ([id number]) -------------------------------------------------------------------------------- Progress Note Details Patient Name: Theresa Hansen Date of Service: 11/29/2017 2:30 PM Medical Record Number: [id number] Patient Account Number: [id number] Date of Birth/Sex: Feb 26, 1996 (21 y.o. F) Treating RN: Curtis Sites Primary Care Provider: Arville Care Other Clinician: Referring Provider: Arville Care Treating Provider/Extender: Maxwell Caul Weeks in Treatment: 3 Subjective History of Present Illness (HPI) 11/03/17 on evaluation today patient presents for initial evaluation concerning two wounds on each the lateral aspect of her abdominal C-section incision. This surgery was actually performed January 2019 and apparently went fairly well although the reason for the C-section was secondary to arrest of dilation at 9 cm. Fortunately delivery went well but unfortunately her postpartum course is complicated by cholecystitis requiring emergent gallbladder removal. Per the patient the surgeon who performed the surgery stated that "this was the worst gallbladder they had ever seen" subsequently following that surgery she was also doing well. On 20 January when she was seen for a postpartum visit incision looked excellent with no evidence of opening at that point. However on August 23, 2017 she presented back to the  OB/GYN office due to a small opening that had occurred at the incision site. There was no fever, chills, she had no significant bleeding and no, pain. With that being said I did agree with the note which was reviewed although I did review multiple notes where it was stated that this was an unusual course and that it appears ace aroma developed but did not show itself until three months after the initial surgery. Unfortunately following that August 23, 2017 visit  the patient subsequently goes through several visits which I did review the records of as well where she basically had one side which would open and then that would be treated the other side was closed and then the opposite will happen with the opposite side opening in the initial side closing. This continued until things appear to be almost completely sealed at the end of May 2019 only to unfortunately completely open more significantly on both sides at which point she was referred to wound care for further evaluation and treatment. This is the state in which I see her today upon my initial evaluation. There does not appear to be any fevers, chills, nausea, vomiting, or diarrhea. The patient really has no major medical problems other than the wound which is given her enough trouble she states. She does take Tylenol as needed she is bottlefeeding at this point and she also takes protonix 20 mg daily. Overall she does have some discomfort but mainly because cleansing or touching wound location. 11/10/17 on evaluation today patient appears to actually be doing a little bit worse in regard to her abdominal ulcer in my pinion. This seems to be a little bit deeper than was previously noted during the last evaluation which has me somewhat concerned. Especially with the abnormal history as far as the presentation and how this developed 2-3 months after the original surgery. Nonetheless I'm not sure exactly what is going on I'm concerned about the  possibility of a fistula. This was discussed with patient and her mother today I believe we may want to have her see a surgeon to discuss any further evaluation in this regard. 11/15/17; patient arrived in my clinic today. I've reviewed our previous notes and essentially agree with a history which I reviewed with the patient today. this was apparently opened 2-3 months after the original surgery.significant original drainage. She does not currently complain of pain, fever or chills drainage. She states that it wasn't for the wounds she wouldn't really have any symptoms at all. She apparently has a appointment made by her clinic with an independent general surgeon. Our intake nurse reports that the wounds have deteriorated. 11/29/17; patient with nonhealing surgical areas on the lower abdominal C-section area. Clearly these areas connect. Ultrasound that I ordered did not show a fluid collection or abscess. Culture showed a few MSSA. I prescribed cephalexin and because [place] the beach she only picked these up yesterday however she is remains systemically well. No fever chills abdominal pain change in bowel habits etc. [facility] which is something we already should've suspected in any case she'll probably get a standard [employer] VAC Theresa Hansen, Theresa M. ([id number]) Objective Constitutional Sitting or standing Blood Pressure is within target range for patient.. Pulse regular and within target range for patient.Marland Kitchen. Respirations regular, non-labored and within target range.. Temperature is normal and within the target range for the patient.Marland Kitchen. appears in no distress. Vitals Time Taken: 2:45 PM, Height: 64 in, Weight: 149 lbs, BMI: 25.6, Temperature: 98.5 F, Pulse: 84 bpm, Respiratory Rate: 16 breaths/min, Blood Pressure: 115/64 mmHg. Eyes Conjunctivae clear. No discharge. Respiratory Respiratory effort is easy and symmetric bilaterally. Rate is normal at rest and  on room air.. Gastrointestinal (GI) Nontender no masses. There is no drainage coming out of the lower abdominal wound. No liver or spleen enlargement or tenderness.. Genitourinary (GU) Bladder without fullness, masses or tenderness.Marland Kitchen. Psychiatric No evidence of depression, anxiety, or agitation. Calm, cooperative,  and communicative. Appropriate interactions and affect.. General Notes: Wound exam; lower pelvic horizontal C-section scar. Larger one is to the right smaller one to the left. Larger wound has a satisfactory looking wound bed today. This clearly tunnels towards the midline and I was able to show the connection which I think was intuitively obvious. Smaller areas on the left. There is no tenderness around the wound no purulent drainage. Integumentary (Hair, Skin) noSystemic skin issues. Wound #1 status is Open. Original cause of wound was Surgical Injury. The wound is located on the Right Abdomen - Lower Quadrant. The wound measures 0.8cm length x 2.2cm width x 1.7cm depth; 1.382cm^2 area and 2.35cm^3 volume. There is no tunneling noted, however, there is undermining starting at 3:00 and ending at 3:00 with a maximum distance of 1.3cm. There is a large amount of purulent drainage noted. The wound margin is distinct with the outline attached to the wound base. There is large (67-100%) red, pink granulation within the wound bed. There is a small (1-33%) amount of necrotic tissue within the wound bed including Adherent Slough. The periwound skin appearance exhibited: Erythema. The periwound skin appearance did not exhibit: Callus, Crepitus, Excoriation, Induration, Rash, Scarring, Dry/Scaly, Maceration, Atrophie Blanche, Cyanosis, Ecchymosis, Hemosiderin Staining, Mottled, Pallor, Rubor. The surrounding wound skin color is noted with erythema which is circumferential. Wound #2 status is Open. Original cause of wound was Surgical Injury. The wound is located on the Left Abdomen -  Lower Quadrant. The wound measures 0.3cm length x 0.5cm width x 0.3cm depth; 0.118cm^2 area and 0.035cm^3 volume. There is Fat Layer (Subcutaneous Tissue) Exposed exposed. There is no tunneling noted, however, there is undermining starting at 9:00 and ending at 9:00 with a maximum distance of 1cm. There is a large amount of purulent drainage noted. The wound margin is indistinct and nonvisible. There is large (67-100%) red, pink granulation within the wound bed. There is a small (1-33%) amount of necrotic tissue within the wound bed. The periwound skin appearance exhibited: Erythema. The periwound skin appearance did not exhibit: Callus, Crepitus, Excoriation, Induration, Rash, Scarring, Dry/Scaly, Maceration, Atrophie Blanche, Cyanosis, Ecchymosis, Hemosiderin Staining, Mottled, Pallor, Rubor. The surrounding wound skin color is noted with erythema which is circumferential. Periwound temperature was noted as No Abnormality. Theresa Hansen, Theresa Hansen ([id number]) Assessment Active Problems ICD-10 Disruption of external operation (surgical) wound, not elsewhere classified, initial encounter Non-pressure chronic ulcer of skin of other sites with fat layer exposed Plan Wound Cleansing: Wound #1 Right Abdomen - Lower Quadrant: Clean wound with Normal Saline. May Shower, gently pat wound dry prior to applying new dressing. Wound #2 Left Abdomen - Lower Quadrant: Clean wound with Normal Saline. May Shower, gently pat wound dry prior to applying new dressing. Primary Wound Dressing: Wound #1 Right Abdomen - Lower Quadrant: Silver Alginate - Rope Wound #2 Left Abdomen - Lower Quadrant: Silver Alginate - Rope Secondary Dressing: Wound #1 Right Abdomen - Lower Quadrant: Boardered Foam Dressing Wound #2 Left Abdomen - Lower Quadrant: Boardered Foam Dressing Dressing Change Frequency: Wound #1 Right Abdomen - Lower Quadrant: Change dressing every day. Wound #2 Left Abdomen - Lower Quadrant: Change  dressing every day. Follow-up Appointments: Wound #1 Right Abdomen - Lower Quadrant: Return Appointment in 1 week. Wound #2 Left Abdomen - Lower Quadrant: Return Appointment in 1 week. Additional Orders / Instructions: Wound #2 Left Abdomen - Lower Quadrant: Activity as tolerated Negative Pressure Wound Therapy: Wound #1 Right Abdomen - Lower Quadrant: Other: - order wound vac Wound #2 Left Abdomen -  Lower Quadrant: Other: - order wound vac Medical Decision Making Disruption of external operation (surgical) wound, not elsewhere classified, initial encounter 11/06/2017 Status: No change Complications: surgical site at the previous cesarean section. 2 wound areas. Continued undermining in both of these areas and indeed I was able to show that these connect.. Interventions: Ultrasound was negative for an abscess or fluid collection Culture for methicillin sensitive staph Theresa Hansen, [facility] ([id number]) aureus she just started antibiotics yesterday Non-pressure chronic ulcer of skin of other sites with fat layer exposed 11/06/2017 Status: No change Complications: surgical wound site at previous cesarean section Interventions: Ordered a wound VAC [employer] will not cover snap vac's. She will likely need a standard presumably [employer] VAC #1 we continued with the silver alginate dressing/silver alginate rope #2 for one reason or another she didn't get her antibiotics until yesterday #3 I think she would benefit from a wound VAC which has the best chance of closing the surgical wound as soon as possible. Ultrasound did not show a flu fluid collection or anything that needed to be drained Electronic Signature(s) Signed: 12/01/2017 7:56:50 AM By: Baltazar Najjar MD Entered By: Baltazar Najjar on 12/01/2017 07:36:19 Theresa Hansen ([id number]) -------------------------------------------------------------------------------- SuperBill Details Patient Name: Theresa Hansen Date of Service:  11/29/2017 Medical Record Number: [id number] Patient Account Number: [id number] Date of Birth/Sex: 06/11/1995 (21 y.o. F) Treating RN: Curtis Sites Primary Care Provider: Arville Care Other Clinician: Referring Provider: Arville Care Treating Provider/Extender: Altamese [place] in Treatment: 3 Diagnosis Coding ICD-10 Codes Code Description T81.31XA Disruption of external operation (surgical) wound, not elsewhere classified, initial encounter L98.492 Non-pressure chronic ulcer of skin of other sites with fat layer exposed Facility Procedures CPT4 Code: 08657846 Description: 99213 - WOUND CARE VISIT-LEV 3 EST PT Modifier: Quantity: 1 Physician Procedures CPT4: Description Modifier Quantity Code [id number] 99213 - WC PHYS LEVEL 3 - EST PT 1 ICD-10 Diagnosis Description T81.31XA Disruption of external operation (surgical) wound, not elsewhere classified, initial encounter L98.492 Non-pressure chronic ulcer of  skin of other sites with fat layer exposed Electronic Signature(s) Signed: 12/01/2017 7:56:50 AM By: Baltazar Najjar MD Entered By: Baltazar Najjar on 12/01/2017 07:36:29

## 2017-12-05 DIAGNOSIS — T8131XA Disruption of external operation (surgical) wound, not elsewhere classified, initial encounter: Secondary | ICD-10-CM | POA: Diagnosis not present

## 2017-12-05 DIAGNOSIS — T8189XA Other complications of procedures, not elsewhere classified, initial encounter: Secondary | ICD-10-CM | POA: Diagnosis not present

## 2017-12-06 ENCOUNTER — Encounter: Payer: BLUE CROSS/BLUE SHIELD | Admitting: Internal Medicine

## 2017-12-06 DIAGNOSIS — T8131XA Disruption of external operation (surgical) wound, not elsewhere classified, initial encounter: Secondary | ICD-10-CM | POA: Diagnosis not present

## 2017-12-06 DIAGNOSIS — T8189XA Other complications of procedures, not elsewhere classified, initial encounter: Secondary | ICD-10-CM | POA: Diagnosis not present

## 2017-12-06 DIAGNOSIS — A4901 Methicillin susceptible Staphylococcus aureus infection, unspecified site: Secondary | ICD-10-CM | POA: Diagnosis not present

## 2017-12-06 DIAGNOSIS — L98492 Non-pressure chronic ulcer of skin of other sites with fat layer exposed: Secondary | ICD-10-CM | POA: Diagnosis not present

## 2017-12-07 DIAGNOSIS — T8131XA Disruption of external operation (surgical) wound, not elsewhere classified, initial encounter: Secondary | ICD-10-CM | POA: Diagnosis not present

## 2017-12-07 DIAGNOSIS — T8189XA Other complications of procedures, not elsewhere classified, initial encounter: Secondary | ICD-10-CM | POA: Diagnosis not present

## 2017-12-08 DIAGNOSIS — T8189XA Other complications of procedures, not elsewhere classified, initial encounter: Secondary | ICD-10-CM | POA: Diagnosis not present

## 2017-12-08 DIAGNOSIS — T8131XA Disruption of external operation (surgical) wound, not elsewhere classified, initial encounter: Secondary | ICD-10-CM | POA: Diagnosis not present

## 2017-12-08 DIAGNOSIS — L98492 Non-pressure chronic ulcer of skin of other sites with fat layer exposed: Secondary | ICD-10-CM | POA: Diagnosis not present

## 2017-12-09 DIAGNOSIS — T8189XA Other complications of procedures, not elsewhere classified, initial encounter: Secondary | ICD-10-CM | POA: Diagnosis not present

## 2017-12-09 DIAGNOSIS — T8131XA Disruption of external operation (surgical) wound, not elsewhere classified, initial encounter: Secondary | ICD-10-CM | POA: Diagnosis not present

## 2017-12-10 DIAGNOSIS — T8189XA Other complications of procedures, not elsewhere classified, initial encounter: Secondary | ICD-10-CM | POA: Diagnosis not present

## 2017-12-10 DIAGNOSIS — T8131XA Disruption of external operation (surgical) wound, not elsewhere classified, initial encounter: Secondary | ICD-10-CM | POA: Diagnosis not present

## 2017-12-11 DIAGNOSIS — T8189XA Other complications of procedures, not elsewhere classified, initial encounter: Secondary | ICD-10-CM | POA: Diagnosis not present

## 2017-12-11 DIAGNOSIS — T8131XA Disruption of external operation (surgical) wound, not elsewhere classified, initial encounter: Secondary | ICD-10-CM | POA: Diagnosis not present

## 2017-12-12 DIAGNOSIS — T8131XA Disruption of external operation (surgical) wound, not elsewhere classified, initial encounter: Secondary | ICD-10-CM | POA: Diagnosis not present

## 2017-12-12 DIAGNOSIS — T8189XA Other complications of procedures, not elsewhere classified, initial encounter: Secondary | ICD-10-CM | POA: Diagnosis not present

## 2017-12-13 ENCOUNTER — Encounter: Payer: BLUE CROSS/BLUE SHIELD | Admitting: Internal Medicine

## 2017-12-13 DIAGNOSIS — O900 Disruption of cesarean delivery wound: Secondary | ICD-10-CM | POA: Diagnosis not present

## 2017-12-13 DIAGNOSIS — L98492 Non-pressure chronic ulcer of skin of other sites with fat layer exposed: Secondary | ICD-10-CM | POA: Diagnosis not present

## 2017-12-13 DIAGNOSIS — T8131XA Disruption of external operation (surgical) wound, not elsewhere classified, initial encounter: Secondary | ICD-10-CM | POA: Diagnosis not present

## 2017-12-13 DIAGNOSIS — T8189XA Other complications of procedures, not elsewhere classified, initial encounter: Secondary | ICD-10-CM | POA: Diagnosis not present

## 2017-12-14 DIAGNOSIS — T8189XA Other complications of procedures, not elsewhere classified, initial encounter: Secondary | ICD-10-CM | POA: Diagnosis not present

## 2017-12-14 DIAGNOSIS — T8131XA Disruption of external operation (surgical) wound, not elsewhere classified, initial encounter: Secondary | ICD-10-CM | POA: Diagnosis not present

## 2017-12-15 ENCOUNTER — Encounter: Payer: BLUE CROSS/BLUE SHIELD | Attending: Physician Assistant

## 2017-12-15 DIAGNOSIS — Y838 Other surgical procedures as the cause of abnormal reaction of the patient, or of later complication, without mention of misadventure at the time of the procedure: Secondary | ICD-10-CM | POA: Insufficient documentation

## 2017-12-15 DIAGNOSIS — T8131XS Disruption of external operation (surgical) wound, not elsewhere classified, sequela: Secondary | ICD-10-CM | POA: Diagnosis not present

## 2017-12-15 DIAGNOSIS — T8189XA Other complications of procedures, not elsewhere classified, initial encounter: Secondary | ICD-10-CM | POA: Diagnosis not present

## 2017-12-15 DIAGNOSIS — T8131XA Disruption of external operation (surgical) wound, not elsewhere classified, initial encounter: Secondary | ICD-10-CM | POA: Diagnosis not present

## 2017-12-15 DIAGNOSIS — L98492 Non-pressure chronic ulcer of skin of other sites with fat layer exposed: Secondary | ICD-10-CM | POA: Insufficient documentation

## 2017-12-15 NOTE — Progress Notes (Addendum)
Theresa Hansen, Theresa Hansen ([id number]) Visit Report for 12/06/2017 Arrival Information Details Patient Name: Theresa Hansen, Theresa Hansen Date of Service: 12/06/2017 3:00 PM Medical Record Number: [id number] Patient Account Number: [id number] Date of Birth/Sex: [birth year] (22 y.o. F) Treating RN: Cornell Barman Primary Care Tishia Maestre: Caryl Pina Other Clinician: Referring Linnie Delgrande: Caryl Pina Treating Raesha Coonrod/Extender: Tito Dine in Treatment: 4 Visit Information History Since Last Visit Added or deleted any medications: No Patient Arrived: Ambulatory Any new allergies or adverse reactions: No Arrival Time: 15:22 Had a fall or experienced change in No Accompanied By: Mother activities of daily living that may affect Transfer Assistance: None risk of falls: Patient Identification Verified: Yes Signs or symptoms of abuse/neglect since last visito No Secondary Verification Process Completed: Yes Hospitalized since last visit: No Patient Requires Transmission-Based No Implantable device outside of the clinic excluding No Precautions: cellular tissue based products placed in the center Patient Has Alerts: No since last visit: Has Dressing in Place as Prescribed: Yes Pain Present Now: No Electronic Signature(s) Signed: 12/11/2017 1:34:30 PM By: Gretta Cool, BSN, RN, CWS, Kim RN, BSN Entered By: Gretta Cool, BSN, RN, CWS, Kim on 12/11/2017 13:34:30 Theresa Hansen ([id number]) -------------------------------------------------------------------------------- Encounter Discharge Information Details Patient Name: Theresa Hansen Date of Service: 12/06/2017 3:00 PM Medical Record Number: [id number] Patient Account Number: [id number] Date of Birth/Sex: September 02, 1995 (22 y.o. F) Treating RN: Cornell Barman Primary Care Aoife Bold: Caryl Pina Other Clinician: Referring Raevon Broom: Caryl Pina Treating Bethene Hankinson/Extender: Tito Dine in Treatment: 4 Encounter Discharge  Information Items Discharge Condition: Stable Ambulatory Status: Ambulatory Discharge Destination: Home Transportation: Private Auto Accompanied By: mom Schedule Follow-up Appointment: Yes Clinical Summary of Care: Electronic Signature(s) Signed: 12/11/2017 1:43:26 PM By: Gretta Cool, BSN, RN, CWS, Kim RN, BSN Entered By: Gretta Cool, BSN, RN, CWS, Kim on 12/11/2017 13:43:26 Theresa Hansen ([id number]) -------------------------------------------------------------------------------- Lower Extremity Assessment Details Patient Name: Theresa Hansen Date of Service: 12/06/2017 3:00 PM Medical Record Number: [id number] Patient Account Number: [id number] Date of Birth/Sex: 06-01-1995 (22 y.o. F) Treating RN: Cornell Barman Primary Care Coley Kulikowski: Caryl Pina Other Clinician: Referring Aniqua Briere: Caryl Pina Treating Bibiana Gillean/Extender: Tito Dine in Treatment: 4 Electronic Signature(s) Signed: 12/11/2017 1:38:08 PM By: Gretta Cool, BSN, RN, CWS, Kim RN, BSN Entered By: Gretta Cool, BSN, RN, CWS, Kim on 12/11/2017 13:38:08 Theresa Hansen ([id number]) -------------------------------------------------------------------------------- Multi Wound Chart Details Patient Name: Theresa Hansen Date of Service: 12/06/2017 3:00 PM Medical Record Number: [id number] Patient Account Number: [id number] Date of Birth/Sex: 03-Jan-1996 (22 y.o. F) Treating RN: Cornell Barman Primary Care Francella Barnett: Caryl Pina Other Clinician: Referring Dewane Timson: Caryl Pina Treating Joelynn Dust/Extender: Tito Dine in Treatment: 4 Vital Signs Height(in): 64 Pulse(bpm): 102 Weight(lbs): 149 Blood Pressure(mmHg): 130/71 Body Mass Index(BMI): 26 Temperature(F): 98.4 Respiratory Rate 18 (breaths/min): Photos: [1:No Photos] [2:No Photos] [N/A:N/A] Wound Location: [1:Right Abdomen - Lower Quadrant] [2:Left Abdomen - Lower Quadrant] [N/A:N/A] Wounding Event: [1:Surgical Injury] [2:Surgical  Injury] [N/A:N/A] Primary Etiology: [1:Dehisced Wound] [2:Dehisced Wound] [N/A:N/A] Date Acquired: [1:09/03/2017] [2:09/03/2017] [N/A:N/A] Weeks of Treatment: [1:4] [2:4] [N/A:N/A] Wound Status: [1:Open] [2:Open] [N/A:N/A] Measurements L x W x D [1:0.5x2.4x2] [2:0.3x0.1x1.3] [N/A:N/A] (cm) Area (cm) : [1:0.942] [2:0.024] [N/A:N/A] Volume (cm) : [1:1.885] [2:0.031] [N/A:N/A] % Reduction in Area: [1:53.90%] [2:78.20%] [N/A:N/A] % Reduction in Volume: [1:29.00%] [2:53.00%] [N/A:N/A] Position 1 (o'clock): [1:3] [2:9] Maximum Distance 1 (cm): [1:3] [2:2] Tunneling: [1:Yes] [2:Yes] [N/A:N/A] Classification: [1:Full Thickness With Exposed Support Structures] [2:Full Thickness Without Exposed Support Structures] [N/A:N/A] Exudate Amount: [1:Large] [2:Large] [N/A:N/A] Exudate Type: [1:Serosanguineous] [2:Serosanguineous] [N/A:N/A] Exudate Color: [1:red, brown] [2:red,  brown] [N/A:N/A] Wound Margin: [1:Distinct, outline attached] [2:Indistinct, nonvisible] [N/A:N/A] Granulation Amount: [1:Large (67-100%)] [2:Large (67-100%)] [N/A:N/A] Granulation Quality: [1:Red, Pink] [2:Red, Pink] [N/A:N/A] Necrotic Amount: [1:Small (1-33%)] [2:None Present (0%)] [N/A:N/A] Exposed Structures: [1:Muscle: Yes Fascia: No Fat Layer (Subcutaneous Tissue) Exposed: No Tendon: No Joint: No Bone: No] [2:Fat Layer (Subcutaneous Tissue) Exposed: Yes Fascia: No Tendon: No Muscle: No Joint: No Bone: No] [N/A:N/A] Epithelialization: [1:None] [2:None] [N/A:N/A] Periwound Skin Texture: [1:Excoriation: No Induration: No Callus: No] [2:Excoriation: No Induration: No Callus: No] [N/A:N/A] Crepitus: No Crepitus: No Rash: No Rash: No Scarring: No Scarring: No Periwound Skin Moisture: Maceration: No Maceration: No N/A Dry/Scaly: No Dry/Scaly: No Periwound Skin Color: Erythema: Yes Atrophie Blanche: No N/A Atrophie Blanche: No Cyanosis: No Cyanosis: No Ecchymosis: No Ecchymosis: No Erythema: No Hemosiderin  Staining: No Hemosiderin Staining: No Mottled: No Mottled: No Pallor: No Pallor: No Rubor: No Rubor: No Erythema Location: Circumferential N/A N/A Temperature: N/A No Abnormality N/A Tenderness on Palpation: No No N/A Wound Preparation: Ulcer Cleansing: Ulcer Cleansing: N/A Rinsed/Irrigated with Saline Rinsed/Irrigated with Saline Topical Anesthetic Applied: Topical Anesthetic Applied: Other: lidocaine % Other: lidocAINE 4% Treatment Notes Electronic Signature(s) Signed: 12/11/2017 1:39:17 PM By: Gretta Cool, BSN, RN, CWS, Kim RN, BSN Entered By: Gretta Cool, BSN, RN, CWS, Kim on 12/11/2017 13:39:16 Theresa Hansen ([id number]) -------------------------------------------------------------------------------- Multi-Disciplinary Care Plan Details Patient Name: Theresa Hansen Date of Service: 12/06/2017 3:00 PM Medical Record Number: [id number] Patient Account Number: [id number] Date of Birth/Sex: 03-08-1996 (22 y.o. F) Treating RN: Cornell Barman Primary Care Cyerra Yim: Caryl Pina Other Clinician: Referring Anayia Eugene: Caryl Pina Treating Paulla Mcclaskey/Extender: Tito Dine in Treatment: 4 Active Inactive ` Orientation to the Wound Care Program Nursing Diagnoses: Knowledge deficit related to the wound healing center program Goals: Patient/caregiver will verbalize understanding of the [facility] Date Initiated: 11/03/2017 Target Resolution Date: 01/20/2018 Goal Status: Active Interventions: Provide education on orientation to the wound center Notes: ` Pain, Acute or Chronic Nursing Diagnoses: Potential alteration in comfort, pain Goals: Patient/caregiver will verbalize comfort level met Date Initiated: 12/11/2017 Target Resolution Date: 12/06/2017 Goal Status: Active Interventions: Complete pain assessment as per visit requirements Treatment Activities: Administer pain control measures as ordered : 12/06/2017 Notes: ` Wound/Skin  Impairment Nursing Diagnoses: Impaired tissue integrity Goals: Ulcer/skin breakdown will heal within 14 weeks Date Initiated: 11/03/2017 Target Resolution Date: 01/20/2018 Theresa Hansen, Theresa Hansen ([id number]) Goal Status: Active Interventions: Assess patient/caregiver ability to obtain necessary supplies Assess patient/caregiver ability to perform ulcer/skin care regimen upon admission and as needed Assess ulceration(s) every visit Notes: Electronic Signature(s) Signed: 12/11/2017 1:39:05 PM By: Gretta Cool, BSN, RN, CWS, Kim RN, BSN Entered By: Gretta Cool, BSN, RN, CWS, Kim on 12/11/2017 13:39:04 Theresa Hansen ([id number]) -------------------------------------------------------------------------------- Negative Pressure Wound Therapy Application (NPWT) Details Patient Name: Theresa Hansen Date of Service: 12/06/2017 3:00 PM Medical Record Number: [id number] Patient Account Number: [id number] Date of Birth/Sex: 02/05/1996 (22 y.o. F) Treating RN: Cornell Barman Primary Care Earlean Fidalgo: Caryl Pina Other Clinician: Referring Rafaella Kole: Caryl Pina Treating Elizette Shek/Extender: Ricard Dillon Weeks in Treatment: 4 NPWT Application Performed for: Wound #1 Right Abdomen - Lower Quadrant Additional Injuries Covered: Yes Additional Injuries: Wound#2 Left Abdomen - Lower Quadrant Performed By: Cornell Barman, RN Type: Other Coverage Size (sq cm): 1.23 Pressure Type: Constant Pressure Setting: 125 mmHG Drain Type: None Quantity of Sponges/Gauze Inserted: 2 Sponge/Dressing Type: Foam, Black Date Initiated: 12/06/2017 Response to Treatment: tolerated well Post Procedure Diagnosis Same as Pre-procedure Electronic Signature(s) Signed: 12/11/2017 1:40:35 PM By: Gretta Cool, BSN, RN, CWS, Kim  RN, BSN Entered By: Gretta Cool, BSN, RN, CWS, Kim on 12/11/2017 13:40:34 Theresa Hansen ([id number]) -------------------------------------------------------------------------------- Pain Assessment Details Patient  Name: Theresa Hansen Date of Service: 12/06/2017 3:00 PM Medical Record Number: [id number] Patient Account Number: [id number] Date of Birth/Sex: 07-25-1995 (22 y.o. F) Treating RN: Cornell Barman Primary Care Provider: Caryl Pina Other Clinician: Referring Provider: Caryl Pina Treating Provider/Extender: Tito Dine in Treatment: 4 Active Problems Location of Pain Severity and Description of Pain Patient Has Paino No Site Locations Pain Management and Medication Current Pain Management: Electronic Signature(s) Signed: 12/11/2017 1:34:36 PM By: Gretta Cool, BSN, RN, CWS, Kim RN, BSN Entered By: Gretta Cool, BSN, RN, CWS, Kim on 12/11/2017 13:34:36 Theresa Hansen ([id number]) -------------------------------------------------------------------------------- Patient/Caregiver Education Details Patient Name: Theresa Hansen Date of Service: 12/06/2017 3:00 PM Medical Record Number: [id number] Patient Account Number: [id number] Date of Birth/Gender: 25-Nov-1995 (22 y.o. F) Treating RN: Cornell Barman Primary Care Physician: Caryl Pina Other Clinician: Referring Physician: Caryl Pina Treating Physician/Extender: Tito Dine in Treatment: 4 Education Assessment Education Provided To: Patient Education Topics Provided Wound/Skin Impairment: Handouts: Caring for Your Ulcer, Other: NPWT as prescribed Methods: Demonstration Responses: State content correctly Electronic Signature(s) Signed: 12/15/2017 6:17:48 PM By: Gretta Cool, BSN, RN, CWS, Kim RN, BSN Entered By: Gretta Cool, BSN, RN, CWS, Kim on 12/11/2017 13:43:57 Theresa Hansen ([id number]) -------------------------------------------------------------------------------- Wound Assessment Details Patient Name: Theresa Hansen Date of Service: 12/06/2017 3:00 PM Medical Record Number: [id number] Patient Account Number: [id number] Date of Birth/Sex: 03/01/1996 (22 y.o. F) Treating RN: Cornell Barman Primary  Care Provider: Caryl Pina Other Clinician: Referring Provider: Caryl Pina Treating Provider/Extender: Ricard Dillon Weeks in Treatment: 4 Wound Status Wound Number: 1 Primary Etiology: Dehisced Wound Wound Location: Right Abdomen - Lower Quadrant Wound Status: Open Wounding Event: Surgical Injury Date Acquired: 09/03/2017 Weeks Of Treatment: 4 Clustered Wound: No Photos Photo Uploaded By: Sharon Mt on 12/13/2017 11:18:31 Wound Measurements Length: (cm) 0.5 Width: (cm) 2.4 Depth: (cm) 2 Area: (cm) 0.942 Volume: (cm) 1.885 % Reduction in Area: 53.9% % Reduction in Volume: 29% Epithelialization: None Tunneling: Yes Position (o'clock): 3 Maximum Distance: (cm) 3 Undermining: No Wound Description Full Thickness With Exposed Support Classification: Structures Wound Margin: Distinct, outline attached Exudate Large Amount: Exudate Type: Serosanguineous Exudate Color: red, brown Foul Odor After Cleansing: No Slough/Fibrino Yes Wound Bed Granulation Amount: Large (67-100%) Exposed Structure Granulation Quality: Red, Pink Fascia Exposed: No Necrotic Amount: Small (1-33%) Fat Layer (Subcutaneous Tissue) Exposed: No Necrotic Quality: Adherent Slough Tendon Exposed: No Muscle Exposed: Yes Necrosis of Muscle: No Joint Exposed: No Tennell, Theresa M. ([id number]) Bone Exposed: No Periwound Skin Texture Texture Color No Abnormalities Noted: No No Abnormalities Noted: No Callus: No Atrophie Blanche: No Crepitus: No Cyanosis: No Excoriation: No Ecchymosis: No Induration: No Erythema: Yes Rash: No Erythema Location: Circumferential Scarring: No Hemosiderin Staining: No Mottled: No Moisture Pallor: No No Abnormalities Noted: No Rubor: No Dry / Scaly: No Maceration: No Wound Preparation Ulcer Cleansing: Rinsed/Irrigated with Saline Topical Anesthetic Applied: Other: lidocaine %, Electronic Signature(s) Signed: 12/11/2017 1:37:04 PM By:  Gretta Cool, BSN, RN, CWS, Kim RN, BSN Entered By: Gretta Cool, BSN, RN, CWS, Kim on 12/11/2017 13:37:03 Theresa Hansen ([id number]) -------------------------------------------------------------------------------- Wound Assessment Details Patient Name: Theresa Hansen Date of Service: 12/06/2017 3:00 PM Medical Record Number: [id number] Patient Account Number: [id number] Date of Birth/Sex: [birth year] (22 y.o. F) Treating RN: Cornell Barman Primary Care Provider: Caryl Pina Other Clinician: Referring Provider: Caryl Pina Treating Provider/Extender: Ricard Dillon Weeks in Treatment: 4  Wound Status Wound Number: 2 Primary Etiology: Dehisced Wound Wound Location: Left Abdomen - Lower Quadrant Wound Status: Open Wounding Event: Surgical Injury Date Acquired: 09/03/2017 Weeks Of Treatment: 4 Clustered Wound: No Photos Photo Uploaded By: Sharon Mt on 12/12/2017 13:12:50 Wound Measurements Length: (cm) 0.3 Width: (cm) 0.1 Depth: (cm) 1.3 Area: (cm) 0.024 Volume: (cm) 0.031 % Reduction in Area: 78.2% % Reduction in Volume: 53% Epithelialization: None Tunneling: Yes Position (o'clock): 9 Maximum Distance: (cm) 2 Undermining: No Wound Description Full Thickness Without Exposed Support Classification: Structures Wound Margin: Indistinct, nonvisible Exudate Large Amount: Exudate Type: Serosanguineous Exudate Color: red, brown Foul Odor After Cleansing: No Slough/Fibrino No Wound Bed Granulation Amount: Large (67-100%) Exposed Structure Granulation Quality: Red, Pink Fascia Exposed: No Necrotic Amount: None Present (0%) Fat Layer (Subcutaneous Tissue) Exposed: Yes Tendon Exposed: No Theresa Hansen, Theresa M. ([id number]) Muscle Exposed: No Joint Exposed: No Bone Exposed: No Periwound Skin Texture Texture Color No Abnormalities Noted: Yes No Abnormalities Noted: No Atrophie Blanche: No Moisture Cyanosis: No No Abnormalities Noted: Yes Ecchymosis: No Erythema:  No Hemosiderin Staining: No Mottled: No Pallor: No Rubor: No Temperature / Pain Temperature: No Abnormality Wound Preparation Ulcer Cleansing: Rinsed/Irrigated with Saline Topical Anesthetic Applied: Other: lidocAINE 4%, Electronic Signature(s) Signed: 12/11/2017 1:37:57 PM By: Gretta Cool, BSN, RN, CWS, Kim RN, BSN Entered By: Gretta Cool, BSN, RN, CWS, Kim on 12/11/2017 13:37:57 Theresa Hansen ([id number]) -------------------------------------------------------------------------------- Vitals Details Patient Name: Theresa Hansen Date of Service: 12/06/2017 3:00 PM Medical Record Number: [id number] Patient Account Number: [id number] Date of Birth/Sex: [birth year] (22 y.o. F) Treating RN: Cornell Barman Primary Care Chakita Mcgraw: Caryl Pina Other Clinician: Referring Emalea Mix: Caryl Pina Treating Jenie Parish/Extender: Tito Dine in Treatment: 4 Vital Signs Time Taken: 15:23 Temperature (F): 98.4 Height (in): 64 Pulse (bpm): 102 Weight (lbs): 149 Respiratory Rate (breaths/min): 18 Body Mass Index (BMI): 25.6 Blood Pressure (mmHg): 130/71 Reference Range: 80 - 120 mg / dl Electronic Signature(s) Signed: 12/11/2017 1:35:10 PM By: Gretta Cool, BSN, RN, CWS, Kim RN, BSN Entered By: Gretta Cool, BSN, RN, CWS, Kim on 12/11/2017 13:35:10

## 2017-12-15 NOTE — Progress Notes (Addendum)
Theresa Hansen, Theresa M. ([id number]) Visit Report for 12/08/2017 Arrival Information Details Patient Name: Theresa Hansen, Theresa M. Date of Service: 12/08/2017 8:00 AM Medical Record Number: [id number] Patient Account Number: [id number] Date of Birth/Sex: 1996-03-29 (21 y.o. F) Treating RN: Huel CoventryWoody, Kim Primary Hansen Theresa Hansen: Theresa CareETTINGER, Theresa Other Clinician: Referring Theresa Hansen: Theresa CareETTINGER, Theresa Treating Theresa Hansen/Extender: Linwood DibblesSTONE Hansen, Theresa Weeks in Treatment: 5 Visit Information History Since Last Visit Added or deleted any medications: No Patient Arrived: Ambulatory Any new allergies or adverse reactions: No Arrival Time: 08:00 Had a fall or experienced change in No Accompanied By: Mom activities of daily living that may affect Transfer Assistance: None risk of falls: Patient Identification Verified: Yes Signs or symptoms of abuse/neglect since last visito No Secondary Verification Process Completed: Yes Hospitalized since last visit: No Patient Requires Transmission-Based No Implantable device outside of the clinic excluding No Precautions: cellular tissue based products placed in the center Patient Has Alerts: No since last visit: Has Dressing in Place as Prescribed: Yes Pain Present Now: No Electronic Signature(s) Signed: 12/11/2017 1:47:04 PM By: Theresa Hansen, BSN, RN, CWS, Kim RN, BSN Previous Signature: 12/11/2017 1:46:55 PM Version By: Theresa Hansen, BSN, RN, CWS, Kim RN, BSN Entered By: Theresa Hansen, BSN, RN, CWS, Kim on 12/11/2017 13:47:03 Theresa Hansen, Theresa M. ([id number]) -------------------------------------------------------------------------------- Encounter Discharge Information Details Patient Name: Theresa Hansen, Theresa M. Date of Service: 12/08/2017 8:00 AM Medical Record Number: [id number] Patient Account Number: [id number] Date of Birth/Sex: 1996-03-29 (21 y.o. F) Treating RN: Huel CoventryWoody, Kim Primary Hansen Derrick Tiegs: Theresa CareETTINGER, Theresa Other Clinician: Referring Jenniffer Vessels: Theresa CareETTINGER, Theresa Treating  Mazal Ebey/Extender: Linwood DibblesSTONE Hansen, Theresa Weeks in Treatment: 5 Encounter Discharge Information Items Discharge Condition: Stable Ambulatory Status: Ambulatory Discharge Destination: Home Transportation: Private Auto Accompanied By: Mom Schedule Follow-up Appointment: Yes Clinical Summary of Hansen: Electronic Signature(s) Signed: 12/11/2017 1:49:44 PM By: Theresa Hansen, BSN, RN, CWS, Kim RN, BSN Entered By: Theresa Hansen, BSN, RN, CWS, Kim on 12/11/2017 13:49:44 Theresa Hansen, Theresa M. ([id number]) -------------------------------------------------------------------------------- Negative Pressure Wound Therapy Maintenance (NPWT) Details Patient Name: Theresa Hansen, Theresa M. Date of Service: 12/08/2017 8:00 AM Medical Record Number: [id number] Patient Account Number: [id number] Date of Birth/Sex: 1996-03-29 (21 y.o. F) Treating RN: Huel CoventryWoody, Kim Primary Hansen Akeisha Lagerquist: Theresa CareETTINGER, Theresa Other Clinician: Referring Rondarius Kadrmas: Theresa CareETTINGER, Theresa Treating Theresa Hansen/Extender: Linwood DibblesSTONE Hansen, Theresa Weeks in Treatment: 5 NPWT Maintenance Performed for: Wound #1 Right Abdomen - Lower Quadrant Additional Injuries Covered: Yes Additional Injuries: Wound #2 Left Abdomen - Lower Quadrant Performed By: Huel CoventryWoody, Kim, RN Type: Other Coverage Size (sq cm): 1.21 Pressure Type: Constant Pressure Setting: 125 mmHG Drain Type: None Sponge/Dressing Type: Foam, Black Date Initiated: 12/06/2017 Dressing Removed: Yes Quantity of Sponges/Gauze Removed: 2 Canister Changed: No Dressing Reapplied: Yes Quantity of Sponges/Gauze Inserted: 2 Respones To Treatment: tolerated well Days On NPWT: 3 Electronic Signature(s) Signed: 12/11/2017 1:48:33 PM By: Theresa Hansen, BSN, RN, CWS, Kim RN, BSN Entered By: Theresa Hansen, BSN, RN, CWS, Kim on 12/11/2017 13:48:33 Theresa Hansen, Theresa Hansen M. ([id number]) -------------------------------------------------------------------------------- Patient/Caregiver Education Details Patient Name: Theresa Hansen, Theresa M. Date of Service: 12/08/2017 8:00  AM Medical Record Number: [id number] Patient Account Number: [id number] Date of Birth/Gender: 1996-03-29 (21 y.o. F) Treating RN: Huel CoventryWoody, Kim Primary Hansen Physician: Theresa CareETTINGER, Theresa Other Clinician: Referring Physician: Arville CareETTINGER, Theresa Treating Physician/Extender: Skeet SimmerSTONE Hansen, Theresa Weeks in Treatment: 5 Education Assessment Education Provided To: Patient Education Topics Provided Wound/Skin Impairment: Handouts: Caring for Your Ulcer, Other: Continue NPWT Methods: Demonstration, Explain/Verbal Responses: State content correctly Electronic Signature(s) Signed: 12/15/2017 6:17:48 PM By: Theresa Hansen, BSN, RN, CWS, Kim RN, BSN Entered By: Theresa Hansen, BSN, RN, CWS, Kim on 12/11/2017 13:49:29 [name], [name] (  [id number]) -------------------------------------------------------------------------------- Wound Assessment Details Patient Name: Theresa Hansen Date of Service: 12/08/2017 8:00 AM Medical Record Number: [id number] Patient Account Number: [id number] Date of Birth/Sex: 1996-04-10 (21 y.o. F) Treating RN: Huel Coventry Primary Hansen Tahlia Deamer: Theresa Hansen Other Clinician: Referring Mckyla Deckman: Theresa Hansen Treating Madlyn Crosby/Extender: Linwood Dibbles, Theresa Weeks in Treatment: 5 Wound Status Wound Number: 1 Primary Etiology: Dehisced Wound Wound Location: Right Abdomen - Lower Quadrant Wound Status: Open Wounding Event: Surgical Injury Date Acquired: 09/03/2017 Weeks Of Treatment: 5 Clustered Wound: No Wound Measurements Length: (cm) 0.5 Width: (cm) 2.4 Depth: (cm) 1.8 Area: (cm) 0.942 Volume: (cm) 1.696 % Reduction in Area: 53.9% % Reduction in Volume: 36.1% Wound Description Full Thickness With Exposed Support Classification: Structures Periwound Skin Texture Texture Color No Abnormalities Noted: No No Abnormalities Noted: No Moisture No Abnormalities Noted: No Electronic Signature(s) Signed: 12/15/2017 6:17:48 PM By: Theresa Gurney, BSN, RN, CWS, Kim RN, BSN Entered By:  Theresa Gurney, BSN, RN, CWS, Kim on 12/11/2017 13:47:37 Theresa Finner ([id number]) -------------------------------------------------------------------------------- Wound Assessment Details Patient Name: Theresa Finner Date of Service: 12/08/2017 8:00 AM Medical Record Number: [id number] Patient Account Number: [id number] Date of Birth/Sex: 1995/11/01 (21 y.o. F) Treating RN: Huel Coventry Primary Hansen Sonam Huelsmann: Theresa Hansen Other Clinician: Referring Arnel Wymer: Theresa Hansen Treating Richa Shor/Extender: Linwood Dibbles, Theresa Weeks in Treatment: 5 Wound Status Wound Number: 2 Primary Etiology: Dehisced Wound Wound Location: Left Abdomen - Lower Quadrant Wound Status: Open Wounding Event: Surgical Injury Date Acquired: 09/03/2017 Weeks Of Treatment: 5 Clustered Wound: No Wound Measurements Length: (cm) 0.1 Width: (cm) 0.1 Depth: (cm) 1 Area: (cm) 0.008 Volume: (cm) 0.008 % Reduction in Area: 92.7% % Reduction in Volume: 87.9% Wound Description Full Thickness Without Exposed Support Classification: Structures Periwound Skin Texture Texture Color No Abnormalities Noted: No No Abnormalities Noted: No Moisture No Abnormalities Noted: No Electronic Signature(s) Signed: 12/15/2017 6:17:48 PM By: Theresa Gurney, BSN, RN, CWS, Kim RN, BSN Entered By: Theresa Gurney, BSN, RN, CWS, Kim on 12/11/2017 13:47:37

## 2017-12-16 DIAGNOSIS — T8189XA Other complications of procedures, not elsewhere classified, initial encounter: Secondary | ICD-10-CM | POA: Diagnosis not present

## 2017-12-16 DIAGNOSIS — T8131XA Disruption of external operation (surgical) wound, not elsewhere classified, initial encounter: Secondary | ICD-10-CM | POA: Diagnosis not present

## 2017-12-17 DIAGNOSIS — T8189XA Other complications of procedures, not elsewhere classified, initial encounter: Secondary | ICD-10-CM | POA: Diagnosis not present

## 2017-12-17 DIAGNOSIS — T8131XA Disruption of external operation (surgical) wound, not elsewhere classified, initial encounter: Secondary | ICD-10-CM | POA: Diagnosis not present

## 2017-12-18 DIAGNOSIS — T8131XS Disruption of external operation (surgical) wound, not elsewhere classified, sequela: Secondary | ICD-10-CM | POA: Diagnosis not present

## 2017-12-18 DIAGNOSIS — T8131XA Disruption of external operation (surgical) wound, not elsewhere classified, initial encounter: Secondary | ICD-10-CM | POA: Diagnosis not present

## 2017-12-18 DIAGNOSIS — L98492 Non-pressure chronic ulcer of skin of other sites with fat layer exposed: Secondary | ICD-10-CM | POA: Diagnosis not present

## 2017-12-18 DIAGNOSIS — T8189XA Other complications of procedures, not elsewhere classified, initial encounter: Secondary | ICD-10-CM | POA: Diagnosis not present

## 2017-12-19 DIAGNOSIS — T8131XA Disruption of external operation (surgical) wound, not elsewhere classified, initial encounter: Secondary | ICD-10-CM | POA: Diagnosis not present

## 2017-12-19 DIAGNOSIS — S31100A Unspecified open wound of abdominal wall, right upper quadrant without penetration into peritoneal cavity, initial encounter: Secondary | ICD-10-CM | POA: Diagnosis not present

## 2017-12-19 DIAGNOSIS — T8189XA Other complications of procedures, not elsewhere classified, initial encounter: Secondary | ICD-10-CM | POA: Diagnosis not present

## 2017-12-20 DIAGNOSIS — T8189XA Other complications of procedures, not elsewhere classified, initial encounter: Secondary | ICD-10-CM | POA: Diagnosis not present

## 2017-12-20 DIAGNOSIS — T8131XA Disruption of external operation (surgical) wound, not elsewhere classified, initial encounter: Secondary | ICD-10-CM | POA: Diagnosis not present

## 2017-12-21 DIAGNOSIS — T8131XA Disruption of external operation (surgical) wound, not elsewhere classified, initial encounter: Secondary | ICD-10-CM | POA: Diagnosis not present

## 2017-12-21 DIAGNOSIS — T8189XA Other complications of procedures, not elsewhere classified, initial encounter: Secondary | ICD-10-CM | POA: Diagnosis not present

## 2017-12-22 DIAGNOSIS — T8189XA Other complications of procedures, not elsewhere classified, initial encounter: Secondary | ICD-10-CM | POA: Diagnosis not present

## 2017-12-22 DIAGNOSIS — T8131XA Disruption of external operation (surgical) wound, not elsewhere classified, initial encounter: Secondary | ICD-10-CM | POA: Diagnosis not present

## 2017-12-23 DIAGNOSIS — T8131XA Disruption of external operation (surgical) wound, not elsewhere classified, initial encounter: Secondary | ICD-10-CM | POA: Diagnosis not present

## 2017-12-23 DIAGNOSIS — T8189XA Other complications of procedures, not elsewhere classified, initial encounter: Secondary | ICD-10-CM | POA: Diagnosis not present

## 2017-12-24 DIAGNOSIS — T8189XA Other complications of procedures, not elsewhere classified, initial encounter: Secondary | ICD-10-CM | POA: Diagnosis not present

## 2017-12-24 DIAGNOSIS — T8131XA Disruption of external operation (surgical) wound, not elsewhere classified, initial encounter: Secondary | ICD-10-CM | POA: Diagnosis not present

## 2017-12-24 NOTE — Progress Notes (Signed)
CHRISANN, MELARAGNO ([id number]) Visit Report for 12/13/2017 HPI Details Patient Name: Theresa, CALEF Date of Service: 12/13/2017 3:15 PM Medical Record Number: [id number] Patient Account Number: [id number] Date of Birth/Sex: 1995/11/29 (21 y.o. F) Treating RN: Huel Coventry Primary Care Provider: Arville Care Other Clinician: Referring Provider: Arville Care Treating Provider/Extender: Altamese [place] in Treatment: 5 History of Present Illness HPI Description: 11/03/17 on evaluation today patient presents for initial evaluation concerning two wounds on each the lateral aspect of her abdominal C-section incision. This surgery was actually performed January 2019 and apparently went fairly well although the reason for the C-section was secondary to arrest of dilation at 9 cm. Fortunately delivery went well but unfortunately her postpartum course is complicated by cholecystitis requiring emergent gallbladder removal. Per the patient the surgeon who performed the surgery stated that "this was the worst gallbladder they had ever seen" subsequently following that surgery she was also doing well. On 20 January when she was seen for a postpartum visit incision looked excellent with no evidence of opening at that point. However on August 23, 2017 she presented back to the OB/GYN office due to a small opening that had occurred at the incision site. There was no fever, chills, she had no significant bleeding and no, pain. With that being said I did agree with the note which was reviewed although I did review multiple notes where it was stated that this was an unusual course and that it appears ace aroma developed but did not show itself until three months after the initial surgery. Unfortunately following that August 23, 2017 visit the patient subsequently goes through several visits which I did review the records of as well where she basically had one side which would open and then that  would be treated the other side was closed and then the opposite will happen with the opposite side opening in the initial side closing. This continued until things appear to be almost completely sealed at the end of May 2019 only to unfortunately completely open more significantly on both sides at which point she was referred to wound care for further evaluation and treatment. This is the state in which I see her today upon my initial evaluation. There does not appear to be any fevers, chills, nausea, vomiting, or diarrhea. The patient really has no major medical problems other than the wound which is given her enough trouble she states. She does take Tylenol as needed she is bottlefeeding at this point and she also takes protonix 20 mg daily. Overall she does have some discomfort but mainly because cleansing or touching wound location. 11/10/17 on evaluation today patient appears to actually be doing a little bit worse in regard to her abdominal ulcer in my pinion. This seems to be a little bit deeper than was previously noted during the last evaluation which has me somewhat concerned. Especially with the abnormal history as far as the presentation and how this developed 2-3 months after the original surgery. Nonetheless I'm not sure exactly what is going on I'm concerned about the possibility of a fistula. This was discussed with patient and her mother today I believe we may want to have her see a surgeon to discuss any further evaluation in this regard. 11/15/17; patient arrived in my clinic today. I've reviewed our previous notes and essentially agree with a history which I reviewed with the patient today. this was apparently opened 2-3 months after the original surgery.significant original drainage. She does not currently complain  of pain, fever or chills drainage. She states that it wasn't for the wounds she wouldn't really have any symptoms at all. She apparently has a appointment made by her  clinic with an independent general surgeon. Our intake nurse reports that the wounds have deteriorated. 11/29/17; patient with nonhealing surgical areas on the lower abdominal C-section area. Clearly these areas connect. Ultrasound that I ordered did not show a fluid collection or abscess. Culture showed a few MSSA. I prescribed cephalexin and [place] the beach she only picked these up yesterday however she is remains systemically well. No fever chills abdominal pain change in bowel habits etc. [facility] will not [facility] a snap VAC which is something we already should've suspected in any case she'll probably get a standard KCI VAC 12/06/17; she is still taking the Keflex without issue. Discharge today with a standard KCI wound VAC 12/13/17; the patient took the [facility] off last night predominantly complaining of irritation in her upper abdomen where the VAC tubing was adhered with tape. She also has some irritation around the actual wounds which looks fungal. In spite of this the tunneling is quite a bit better Theresa, Hansen ([id number]) Electronic Signature(s) Signed: 12/13/2017 6:17:43 PM By: Baltazar Najjar MD Entered By: Baltazar Najjar on 12/13/2017 17:48:30 Theresa Hansen ([id number]) -------------------------------------------------------------------------------- Physical Exam Details Patient Name: Theresa Hansen Date of Service: 12/13/2017 3:15 PM Medical Record Number: [id number] Patient Account Number: [id number] Date of Birth/Sex: [birth year] (21 y.o. F) Treating RN: Huel Coventry Primary Care Provider: Arville Care Other Clinician: Referring Provider: Arville Care Treating Provider/Extender: Maxwell Caul Weeks in Treatment: 5 Notes wound exam; lower pelvic horizontal C-section scar. The larger wound on the right is quite a bit smaller as is the smaller wound on the left. There is still some tunneling although they've these wounds now do not  connect. Around both wounds there looks to be candidal involvement although this is minor. Tape injury superiorly in the epigastrium I think can be addressed by simply moving that site Electronic Signature(s) Signed: 12/13/2017 6:17:43 PM By: Baltazar Najjar MD Entered By: Baltazar Najjar on 12/13/2017 17:49:25 Theresa Hansen ([id number]) -------------------------------------------------------------------------------- Physician Orders Details Patient Name: Theresa Hansen Date of Service: 12/13/2017 3:15 PM Medical Record Number: [id number] Patient Account Number: [id number] Date of Birth/Sex: [birth year] (21 y.o. F) Treating RN: Huel Coventry Primary Care Provider: Arville Care Other Clinician: Referring Provider: Arville Care Treating Provider/Extender: Altamese [place] in Treatment: 5 Verbal / Phone Orders: No Diagnosis Coding Wound Cleansing Wound #1 Right Abdomen - Lower Quadrant o Clean wound with Normal Saline. o May Shower, gently pat wound dry prior to applying new dressing. Wound #2 Left Abdomen - Lower Quadrant o Clean wound with Normal Saline. o May Shower, gently pat wound dry prior to applying new dressing. Anesthetic (add to Medication List) Wound #1 Right Abdomen - Lower Quadrant o Topical Lidocaine 4% cream applied to wound bed prior to debridement (In Clinic Only). Wound #2 Left Abdomen - Lower Quadrant o Topical Lidocaine 4% cream applied to wound bed prior to debridement (In Clinic Only). Skin Barriers/Peri-Wound Care Wound #1 Right Abdomen - Lower Quadrant o Antifungal powder-Nystatin o Skin Prep Wound #2 Left Abdomen - Lower Quadrant o Antifungal powder-Nystatin o Skin Prep Dressing Change Frequency Wound #1 Right Abdomen - Lower Quadrant o Change Dressing Monday, Wednesday, Friday Wound #2 Left Abdomen - Lower Quadrant o Change Dressing Monday, Wednesday, Friday Follow-up Appointments Wound #1 Right Abdomen -  Lower Quadrant o Return Appointment in 1 week. o Nurse Visit as needed - Friday Wound #2 Left Abdomen - Lower Quadrant o Return Appointment in 1 week. Negative Pressure Wound Therapy Wound #1 Right Abdomen - Lower [place], Wynter M. ([id number]) o Wound VAC settings at 125/130 mmHg continuous pressure. Use BLACK/GREEN foam to wound cavity. Use WHITE foam to fill any tunnel/s and/or undermining. Change VAC dressing 2 X WEEK. Change canister as indicated when full. Nurse may titrate settings and frequency of dressing changes as clinically indicated. o Home Health Nurse may d/c VAC for s/s of increased infection, significant wound regression, or uncontrolled drainage. [facility] at [phone]. o Number of foam/gauze pieces used in the dressing = - 2 Wound #2 Left Abdomen - Lower Quadrant o Wound VAC settings at 125/130 mmHg continuous pressure. Use BLACK/GREEN foam to wound cavity. Use WHITE foam to fill any tunnel/s and/or undermining. Change VAC dressing 2 X WEEK. Change canister as indicated when full. Nurse may titrate settings and frequency of dressing changes as clinically indicated. o Home Health Nurse may d/c VAC for s/s of increased infection, significant wound regression, or uncontrolled drainage. [facility] at [phone]. o Number of foam/gauze pieces used in the dressing = - 2 Electronic Signature(s) Signed: 12/13/2017 6:17:43 PM By: Baltazar Najjarobson, Gurnie Duris MD Signed: 12/15/2017 6:17:48 PM By: Elliot GurneyWoody, BSN, RN, CWS, Kim RN, BSN Entered By: Elliot GurneyWoody, BSN, RN, CWS, Kim on 12/13/2017 16:24:25 Theresa FinnerBULLINS, Theresa M. ([id number]) -------------------------------------------------------------------------------- Problem List Details Patient Name: Theresa FinnerBULLINS, Sanja M. Date of Service: 12/13/2017 3:15 PM Medical Record Number: [id number] Patient Account Number: [id number] Date of Birth/Sex: 07/12/1995 (21 y.o. F) Treating RN: Huel CoventryWoody, Kim Primary  Care Provider: Arville CareETTINGER, JOSHUA Other Clinician: Referring Provider: Arville CareETTINGER, JOSHUA Treating Provider/Extender: Altamese CarolinaOBSON, Angel Weedon G Weeks in Treatment: 5 Active Problems ICD-10 Evaluated Encounter Code Description Active Date Today Diagnosis T81.31XA Disruption of external operation (surgical) wound, not 11/06/2017 No Yes elsewhere classified, initial encounter L98.492 Non-pressure chronic ulcer of skin of other sites with fat layer 11/06/2017 No Yes exposed Inactive Problems Resolved Problems Electronic Signature(s) Signed: 12/13/2017 6:17:43 PM By: Baltazar Najjarobson, Jasey Cortez MD Entered By: Baltazar Najjarobson, Demitrios Molyneux on 12/13/2017 17:47:36 Theresa FinnerBULLINS, Theresa M. ([id number]) -------------------------------------------------------------------------------- Progress Note Details Patient Name: Theresa FinnerBULLINS, Guelda M. Date of Service: 12/13/2017 3:15 PM Medical Record Number: [id number] Patient Account Number: [id number] Date of Birth/Sex: 07/12/1995 (21 y.o. F) Treating RN: Huel CoventryWoody, Kim Primary Care Provider: Arville CareETTINGER, JOSHUA Other Clinician: Referring Provider: Arville CareETTINGER, JOSHUA Treating Provider/Extender: Maxwell CaulOBSON, Gurjot Brisco G Weeks in Treatment: 5 Subjective History of Present Illness (HPI) 11/03/17 on evaluation today patient presents for initial evaluation concerning two wounds on each the lateral aspect of her abdominal C-section incision. This surgery was actually performed January 2019 and apparently went fairly well although the reason for the C-section was secondary to arrest of dilation at 9 cm. Fortunately delivery went well but unfortunately her postpartum course is complicated by cholecystitis requiring emergent gallbladder removal. Per the patient the surgeon who performed the surgery stated that "this was the worst gallbladder they had ever seen" subsequently following that surgery she was also doing well. On 20 January when she was seen for a postpartum visit incision looked excellent with no evidence  of opening at that point. However on August 23, 2017 she presented back to the OB/GYN office due to a small opening that had occurred at the incision site. There was no fever, chills, she had no significant bleeding and no, pain. With that being said I did agree with the  note which was reviewed although I did review multiple notes where it was stated that this was an unusual course and that it appears ace aroma developed but did not show itself until three months after the initial surgery. Unfortunately following that August 23, 2017 visit the patient subsequently goes through several visits which I did review the records of as well where she basically had one side which would open and then that would be treated the other side was closed and then the opposite will happen with the opposite side opening in the initial side closing. This continued until things appear to be almost completely sealed at the end of May 2019 only to unfortunately completely open more significantly on both sides at which point she was referred to wound care for further evaluation and treatment. This is the state in which I see her today upon my initial evaluation. There does not appear to be any fevers, chills, nausea, vomiting, or diarrhea. The patient really has no major medical problems other than the wound which is given her enough trouble she states. She does take Tylenol as needed she is bottlefeeding at this point and she also takes protonix 20 mg daily. Overall she does have some discomfort but mainly because cleansing or touching wound location. 11/10/17 on evaluation today patient appears to actually be doing a little bit worse in regard to her abdominal ulcer in my pinion. This seems to be a little bit deeper than was previously noted during the last evaluation which has me somewhat concerned. Especially with the abnormal history as far as the presentation and how this developed 2-3 months after the original surgery.  Nonetheless I'm not sure exactly what is going on I'm concerned about the possibility of a fistula. This was discussed with patient and her mother today I believe we may want to have her see a surgeon to discuss any further evaluation in this regard. 11/15/17; patient arrived in my clinic today. I've reviewed our previous notes and essentially agree with a history which I reviewed with the patient today. this was apparently opened 2-3 months after the original surgery.significant original drainage. She does not currently complain of pain, fever or chills drainage. She states that it wasn't for the wounds she wouldn't really have any symptoms at all. She apparently has a appointment made by her clinic with an independent general surgeon. Our intake nurse reports that the wounds have deteriorated. 11/29/17; patient with nonhealing surgical areas on the lower abdominal C-section area. Clearly these areas connect. Ultrasound that I ordered did not show a fluid collection or abscess. Culture showed a few MSSA. I prescribed cephalexin and because [place] was at the beach she only picked these up yesterday however she is remains systemically well. No fever chills abdominal pain change in bowel habits etc. [facility] will not cover a snap VAC which is something we already should've suspected in any case she'll probably get a standard KCI VAC 12/06/17; she is still taking the Keflex without issue. Discharge today with a standard KCI wound VAC 12/13/17; the patient took the [facility] off last night predominantly complaining of irritation in her upper abdomen where the VAC tubing was adhered with tape. She also has some irritation around the actual wounds which looks fungal. In spite of this the tunneling is quite a bit better Theresa Hansen, Theresa Hansen. ([id number]) Objective Constitutional Vitals Time Taken: 3:50 PM, Height: 64 in, Weight: 149 lbs, BMI: 25.6, Temperature: 98.3 F, Pulse: 88 bpm, Respiratory Rate:  18 breaths/min, Blood Pressure: 100/50 mmHg. Integumentary (Hair, Skin) Wound #1 status is Open. Original cause of wound was Surgical Injury. The wound is located on the Right Abdomen - Lower Quadrant. The wound measures 2cm length x 0.5cm width x 1.5cm depth; 0.785cm^2 area and 1.178cm^3 volume. There is no tunneling noted, however, there is undermining starting at 3:00 and ending at 4:00 with a maximum distance of 3.4cm. There is a small amount of serosanguineous drainage noted. The wound margin is distinct with the outline attached to the wound base. There is large (67-100%) red granulation within the wound bed. There is no necrotic tissue within the wound bed. Periwound temperature was noted as No Abnormality. The periwound has tenderness on palpation. Wound #2 status is Open. Original cause of wound was Surgical Injury. The wound is located on the Left Abdomen - Lower Quadrant. The wound measures 0.5cm length x 0.2cm width x 0.1cm depth; 0.079cm^2 area and 0.008cm^3 volume. There is no tunneling or undermining noted. There is a small amount of serosanguineous drainage noted. The wound margin is distinct with the outline attached to the wound base. There is large (67-100%) red granulation within the wound bed. There is no necrotic tissue within the wound bed. The periwound skin appearance exhibited: Scarring. Periwound temperature was noted as No Abnormality. The periwound has tenderness on palpation. Assessment Active Problems ICD-10 Disruption of external operation (surgical) wound, not elsewhere classified, initial encounter Non-pressure chronic ulcer of skin of other sites with fat layer exposed Plan Wound Cleansing: Wound #1 Right Abdomen - Lower Quadrant: Clean wound with Normal Saline. May Shower, gently pat wound dry prior to applying new dressing. Wound #2 Left Abdomen - Lower Quadrant: Clean wound with Normal Saline. May Shower, gently pat wound dry prior to applying new  dressing. Anesthetic (add to Medication List): Wound #1 Right Abdomen - Lower Quadrant: Topical Lidocaine 4% cream applied to wound bed prior to debridement (In Clinic Only). Wound #2 Left Abdomen - Lower Quadrant: Topical Lidocaine 4% cream applied to wound bed prior to debridement (In Clinic Only). Theresa Hansen, Theresa Hansen ([id number]) Skin Barriers/Peri-Wound Care: Wound #1 Right Abdomen - Lower Quadrant: Antifungal powder-Nystatin Skin Prep Wound #2 Left Abdomen - Lower Quadrant: Antifungal powder-Nystatin Skin Prep Dressing Change Frequency: Wound #1 Right Abdomen - Lower Quadrant: Change Dressing Monday, Wednesday, Friday Wound #2 Left Abdomen - Lower Quadrant: Change Dressing Monday, Wednesday, Friday Follow-up Appointments: Wound #1 Right Abdomen - Lower Quadrant: Return Appointment in 1 week. Nurse Visit as needed - Friday Wound #2 Left Abdomen - Lower Quadrant: Return Appointment in 1 week. Negative Pressure Wound Therapy: Wound #1 Right Abdomen - Lower Quadrant: Wound VAC settings at 125/130 mmHg continuous pressure. Use BLACK/GREEN foam to wound cavity. Use WHITE foam to fill any tunnel/s and/or undermining. Change VAC dressing 2 X WEEK. Change canister as indicated when full. Nurse may titrate settings and frequency of dressing changes as clinically indicated. Home Health Nurse may d/c VAC for s/s of increased infection, significant wound regression, or uncontrolled drainage. Not[facility] at [phone]. Number of foam/gauze pieces used in the dressing = - 2 Wound #2 Left Abdomen - Lower Quadrant: Wound VAC settings at 125/130 mmHg continuous pressure. Use BLACK/GREEN foam to wound cavity. Use WHITE foam to fill any tunnel/s and/or undermining. Change VAC dressing 2 X WEEK. Change canister as indicated when full. Nurse may titrate settings and frequency of dressing changes as clinically indicated. Home Health Nurse may d/c VAC for s/s of increased infection,  significant wound regression, or uncontrolled drainage. [facility] at [phone]. Number of foam/gauze pieces used in the dressing = - 2 #1 I would like to reapply the wound VAC. I am anxious to get the tunnel to close over and it seems we've taken some steps in that direction. #2 if the tunnel will close over then I think we can use alternative dressings to get final closure of the wounds which are already quite a bit smaller Electronic Signature(s) Signed: 12/13/2017 6:17:43 PM By: Baltazar Najjar MD Entered By: Baltazar Najjar on 12/13/2017 17:50:31 Theresa Hansen ([id number]) -------------------------------------------------------------------------------- SuperBill Details Patient Name: Theresa Hansen Date of Service: 12/13/2017 Medical Record Number: [id number] Patient Account Number: [id number] Date of Birth/Sex: Dec 24, 1995 (21 y.o. F) Treating RN: Huel Coventry Primary Care Provider: Arville Care Other Clinician: Referring Provider: Arville Care Treating Provider/Extender: Maxwell Caul Weeks in Treatment: 5 Diagnosis Coding ICD-10 Codes Code Description T81.31XA Disruption of external operation (surgical) wound, not elsewhere classified, initial encounter L98.492 Non-pressure chronic ulcer of skin of other sites with fat layer exposed Facility Procedures CPT4: Description Modifier Quantity Code [id number] - WOUND VAC-50 SQ CM OR LESS 1 ICD-10 Diagnosis Description T81.31XA Disruption of external operation (surgical) wound, not elsewhere classified, initial encounter L98.492 Non-pressure chronic ulcer  of skin of other sites with fat layer exposed Physician Procedures CPT4: Description Modifier Quantity Code [id number] - WC PHYS LEVEL 2 - EST PT 1 ICD-10 Diagnosis Description T81.31XA Disruption of external operation (surgical) wound, not elsewhere classified, initial encounter L98.492 Non-pressure chronic ulcer of  skin of other sites  with fat layer exposed Electronic Signature(s) Signed: 12/13/2017 6:17:43 PM By: Baltazar Najjar MD Entered By: Baltazar Najjar on 12/13/2017 17:51:19

## 2017-12-24 NOTE — Progress Notes (Signed)
ENZA, SHONE ([id number]) Visit Report for 12/06/2017 HPI Details Patient Name: Theresa Hansen, Theresa Hansen Date of Service: 12/06/2017 3:00 PM Medical Record Number: [id number] Patient Account Number: [id number] Date of Birth/Sex: Oct 19, 1995 (22 y.o. F) Treating RN: Huel Coventry Primary Care Provider: Arville Care Other Clinician: Referring Provider: Arville Care Treating Provider/Extender: Altamese [place] in Treatment: 4 History of Present Illness HPI Description: 11/03/17 on evaluation today patient presents for initial evaluation concerning two wounds on each the lateral aspect of her abdominal C-section incision. This surgery was actually performed January 2019 and apparently went fairly well although the reason for the C-section was secondary to arrest of dilation at 9 cm. Fortunately delivery went well but unfortunately her postpartum course is complicated by cholecystitis requiring emergent gallbladder removal. Per the patient the surgeon who performed the surgery stated that "this was the worst gallbladder they had ever seen" subsequently following that surgery she was also doing well. On 20 January when she was seen for a postpartum visit incision looked excellent with no evidence of opening at that point. However on August 23, 2017 she presented back to the OB/GYN office due to a small opening that had occurred at the incision site. There was no fever, chills, she had no significant bleeding and no, pain. With that being said I did agree with the note which was reviewed although I did review multiple notes where it was stated that this was an unusual course and that it appears ace aroma developed but did not show itself until three months after the initial surgery. Unfortunately following that August 23, 2017 visit the patient subsequently goes through several visits which I did review the records of as well where she basically had one side which would open and then that  would be treated the other side was closed and then the opposite will happen with the opposite side opening in the initial side closing. This continued until things appear to be almost completely sealed at the end of May 2019 only to unfortunately completely open more significantly on both sides at which point she was referred to wound care for further evaluation and treatment. This is the state in which I see her today upon my initial evaluation. There does not appear to be any fevers, chills, nausea, vomiting, or diarrhea. The patient really has no major medical problems other than the wound which is given her enough trouble she states. She does take Tylenol as needed she is bottlefeeding at this point and she also takes protonix 20 mg daily. Overall she does have some discomfort but mainly because cleansing or touching wound location. 11/10/17 on evaluation today patient appears to actually be doing a little bit worse in regard to her abdominal ulcer in my pinion. This seems to be a little bit deeper than was previously noted during the last evaluation which has me somewhat concerned. Especially with the abnormal history as far as the presentation and how this developed 2-3 months after the original surgery. Nonetheless I'm not sure exactly what is going on I'm concerned about the possibility of a fistula. This was discussed with patient and her mother today I believe we may want to have her see a surgeon to discuss any further evaluation in this regard. 11/15/17; patient arrived in my clinic today. I've reviewed our previous notes and essentially agree with a history which I reviewed with the patient today. this was apparently opened 2-3 months after the original surgery.significant original drainage. She does not currently complain  of pain, fever or chills drainage. She states that it wasn't for the wounds she wouldn't really have any symptoms at all. She apparently has a appointment made by her  clinic with an independent general surgeon. Our intake nurse reports that the wounds have deteriorated. 11/29/17; patient with nonhealing surgical areas on the lower abdominal C-section area. Clearly these areas connect. Ultrasound that I ordered did not show a fluid collection or abscess. Culture showed a few MSSA. I prescribed cephalexin and because [place] the beach she only picked these up yesterday however she is remains systemically well. No fever chills abdominal pain change in bowel habits etc. [facility] VAC which is something we already should've suspected in any case she'll probably get a standard KCI VAC 12/06/17; she is still taking the Keflex without issue. Discharge today with a standard KCI wound VAC Electronic Signature(s) Signed: 12/13/2017 8:05:08 AM By: Baltazar Najjarobson, Michael MD Theresa Hansen, Boluwatife M. ([id number]) Entered By: Baltazar Najjarobson, Michael on 12/12/2017 21:49:03 Theresa Hansen, [name] ([id number]) -------------------------------------------------------------------------------- Physical Exam Details Patient Name: Theresa Hansen, Theresa M. Date of Service: 12/06/2017 3:00 PM Medical Record Number: [id number] Patient Account Number: [id number] Date of Birth/Sex: [birth year] (22 y.o. F) Treating RN: Huel CoventryWoody, Kim Primary Care Provider: Arville CareETTINGER, JOSHUA Other Clinician: Referring Provider: Arville CareETTINGER, JOSHUA Treating Provider/Extender: Maxwell CaulOBSON, MICHAEL G Weeks in Treatment: 4 Constitutional Sitting or standing Blood Pressure is within target range for patient.. Supine Blood Pressure is within target range for patient.. Pulse regular and within target range for patient.Marland Kitchen. Respirations regular, non-labored and within target range.. Temperature is normal and within the target range for the patient.Marland Kitchen. appears in no distress. Gastrointestinal (GI) Abdomen is soft and non-distended without masses or tenderness. Bowel sounds active in all quadrants.. No liver or  spleen enlargement or tenderness.. Genitourinary (GU) Bladder without fullness, masses or tenderness.. Integumentary (Hair, Skin) no systemic skin issues are seen. Psychiatric No evidence of depression, anxiety, or agitation. Calm, cooperative, and communicative. Appropriate interactions and affect.. Notes when exam; lower pelvic horizontal C-section scar. Larger one on the right smaller to the left these can neck there is no major drainage no surrounding erythema and no tenderness. Electronic Signature(s) Signed: 12/13/2017 8:05:08 AM By: Baltazar Najjarobson, Michael MD Entered By: Baltazar Najjarobson, Michael on 12/12/2017 21:50:35 Theresa Hansen, Raiya M. ([id number]) -------------------------------------------------------------------------------- Physician Orders Details Patient Name: Theresa Hansen, Lucresha M. Date of Service: 12/06/2017 3:00 PM Medical Record Number: [id number] Patient Account Number: [id number] Date of Birth/Sex: [birth year] (22 y.o. F) Treating RN: Huel CoventryWoody, Kim Primary Care Provider: Arville CareETTINGER, JOSHUA Other Clinician: Referring Provider: Arville CareETTINGER, JOSHUA Treating Provider/Extender: Altamese CarolinaOBSON, MICHAEL G Weeks in Treatment: 4 Verbal / Phone Orders: No Diagnosis Coding Wound Cleansing Wound #1 Right Abdomen - Lower Quadrant o Clean wound with Normal Saline. o May Shower, gently pat wound dry prior to applying new dressing. Wound #2 Left Abdomen - Lower Quadrant o Clean wound with Normal Saline. o May Shower, gently pat wound dry prior to applying new dressing. Anesthetic (add to Medication List) Wound #1 Right Abdomen - Lower Quadrant o Topical Lidocaine 4% cream applied to wound bed prior to debridement (In Clinic Only). Wound #2 Left Abdomen - Lower Quadrant o Topical Lidocaine 4% cream applied to wound bed prior to debridement (In Clinic Only). Skin Barriers/Peri-Wound Care Wound #1 Right Abdomen - Lower Quadrant o Skin Prep Wound #2 Left Abdomen - Lower Quadrant o Skin  Prep Dressing Change Frequency Wound #1 Right Abdomen - Lower Quadrant o Change Dressing Monday, Wednesday, Friday Wound #2 Left  Abdomen - Lower Quadrant o Change Dressing Monday, Wednesday, Friday Follow-up Appointments Wound #1 Right Abdomen - Lower Quadrant o Return Appointment in 1 week. o Nurse Visit as needed - Friday Wound #2 Left Abdomen - Lower Quadrant o Return Appointment in 1 week. Negative Pressure Wound Therapy Wound #1 Right Abdomen - Lower Quadrant o Wound VAC settings at 125/130 mmHg continuous pressure. Use BLACK/GREEN foam to wound cavity. Use WHITE foam to fill any tunnel/s and/or undermining. Change VAC dressing 2 X WEEK. Change canister as indicated when full. Nurse may titrate settings and frequency of dressing changes as clinically indicated. JANAUTICA, NETZLEY ([id number]) o Home Health Nurse may d/c VAC for s/s of increased infection, significant wound regression, or uncontrolled drainage. [facility] at [phone]. o Number of foam/gauze pieces used in the dressing = - 2 Wound #2 Left Abdomen - Lower Quadrant o Wound VAC settings at 125/130 mmHg continuous pressure. Use BLACK/GREEN foam to wound cavity. Use WHITE foam to fill any tunnel/s and/or undermining. Change VAC dressing 2 X WEEK. Change canister as indicated when full. Nurse may titrate settings and frequency of dressing changes as clinically indicated. o Home Health Nurse may d/c VAC for s/s of increased infection, significant wound regression, or uncontrolled drainage. [facility] at [phone]. o Number of foam/gauze pieces used in the dressing = - 2 Electronic Signature(s) Signed: 12/13/2017 8:05:08 AM By: Baltazar Najjar MD Signed: 12/15/2017 6:17:48 PM By: Elliot Gurney BSN, RN, CWS, Kim RN, BSN Previous Signature: 12/11/2017 1:42:13 PM Version By: Elliot Gurney, BSN, RN, CWS, Kim RN, BSN Entered By: Elliot Gurney, BSN, RN, CWS, Kim on 12/11/2017 13:44:25 WRENLY, LAURITSEN ([id number]) -------------------------------------------------------------------------------- Problem List Details Patient Name: Theresa Finner Date of Service: 12/06/2017 3:00 PM Medical Record Number: [id number] Patient Account Number: [id number] Date of Birth/Sex: 1995/07/19 (21 y.o. F) Treating RN: Huel Coventry Primary Care Provider: Arville Care Other Clinician: Referring Provider: Arville Care Treating Provider/Extender: Altamese [place] in Treatment: 4 Active Problems ICD-10 Evaluated Encounter Code Description Active Date Today Diagnosis T81.31XA Disruption of external operation (surgical) wound, not 11/06/2017 Yes Yes elsewhere classified, initial encounter Status Complications Interventions No surgical site at the previous cesarean section. 2 wound oUltrasound was change areas. Continued undermining in both of these areas and negative for an indeed I was able to show that these connect.Marland Kitchen abscess or fluid Medical collection oCulture Decision for methicillin Making : sensitive staph aureus she just started antibiotics yesterday L98.492 Non-pressure chronic ulcer of skin of other sites with fat 11/06/2017 Yes Yes layer exposed Status Complications Interventions Medical No surgical wound site at previous cesarean section discharge with a Decision change standard KCI wound Making : VAC Inactive Problems Resolved Problems Electronic Signature(s) Signed: 12/13/2017 8:05:08 AM By: Baltazar Najjar MD Entered By: Baltazar Najjar on 12/12/2017 21:48:05 Theresa Finner ([id number]) -------------------------------------------------------------------------------- Progress Note Details Patient Name: Theresa Finner Date of Service: 12/06/2017 3:00 PM Medical Record Number: [id number] Patient Account Number: [id number] Date of Birth/Sex: July 31, 1995 (21 y.o. F) Treating RN: Huel Coventry Primary Care Provider: Arville Care Other  Clinician: Referring Provider: Arville Care Treating Provider/Extender: Maxwell Caul Weeks in Treatment: 4 Subjective History of Present Illness (HPI) 11/03/17 on evaluation today patient presents for initial evaluation concerning two wounds on each the lateral aspect of her abdominal C-section incision. This surgery was actually performed January 2019 and apparently went fairly well although the reason for the C-section was secondary to arrest of dilation at 9 cm. Fortunately delivery went well  but unfortunately her postpartum course is complicated by cholecystitis requiring emergent gallbladder removal. Per the patient the surgeon who performed the surgery stated that "this was the worst gallbladder they had ever seen" subsequently following that surgery she was also doing well. On 20 January when she was seen for a postpartum visit incision looked excellent with no evidence of opening at that point. However on August 23, 2017 she presented back to the OB/GYN office due to a small opening that had occurred at the incision site. There was no fever, chills, she had no significant bleeding and no, pain. With that being said I did agree with the note which was reviewed although I did review multiple notes where it was stated that this was an unusual course and that it appears ace aroma developed but did not show itself until three months after the initial surgery. Unfortunately following that August 23, 2017 visit the patient subsequently goes through several visits which I did review the records of as well where she basically had one side which would open and then that would be treated the other side was closed and then the opposite will happen with the opposite side opening in the initial side closing. This continued until things appear to be almost completely sealed at the end of May 2019 only to unfortunately completely open more significantly on both sides at which point she was  referred to wound care for further evaluation and treatment. This is the state in which I see her today upon my initial evaluation. There does not appear to be any fevers, chills, nausea, vomiting, or diarrhea. The patient really has no major medical problems other than the wound which is given her enough trouble she states. She does take Tylenol as needed she is bottlefeeding at this point and she also takes protonix 20 mg daily. Overall she does have some discomfort but mainly because cleansing or touching wound location. 11/10/17 on evaluation today patient appears to actually be doing a little bit worse in regard to her abdominal ulcer in my pinion. This seems to be a little bit deeper than was previously noted during the last evaluation which has me somewhat concerned. Especially with the abnormal history as far as the presentation and how this developed 2-3 months after the original surgery. Nonetheless I'm not sure exactly what is going on I'm concerned about the possibility of a fistula. This was discussed with patient and her mother today I believe we may want to have her see a surgeon to discuss any further evaluation in this regard. 11/15/17; patient arrived in my clinic today. I've reviewed our previous notes and essentially agree with a history which I reviewed with the patient today. this was apparently opened 2-3 months after the original surgery.significant original drainage. She does not currently complain of pain, fever or chills drainage. She states that it wasn't for the wounds she wouldn't really have any symptoms at all. She apparently has a appointment made by her clinic with an independent general surgeon. Our intake nurse reports that the wounds have deteriorated. 11/29/17; patient with nonhealing surgical areas on the lower abdominal C-section area. Clearly these areas connect. Ultrasound that I ordered did not show a fluid collection or abscess. Culture showed a few MSSA. I  prescribed cephalexin and because [place] was at the beach she only picked these up yesterday however she is remains systemically well. No fever chills abdominal pain change in bowel habits etc. [facility] will not cover a  snap VAC which is something we already should've suspected in any case she'll probably get a standard KCI VAC 12/06/17; she is still taking the Keflex without issue. Discharge today with a standard KCI wound VAC Mikula, Melessa M. ([id number]) Objective Constitutional Sitting or standing Blood Pressure is within target range for patient.. Supine Blood Pressure is within target range for patient.. Pulse regular and within target range for patient.Marland Kitchen Respirations regular, non-labored and within target range.. Temperature is normal and within the target range for the patient.Marland Kitchen appears in no distress. Vitals Time Taken: 3:23 PM, Height: 64 in, Weight: 149 lbs, BMI: 25.6, Temperature: 98.4 F, Pulse: 102 bpm, Respiratory Rate: 18 breaths/min, Blood Pressure: 130/71 mmHg. Gastrointestinal (GI) Abdomen is soft and non-distended without masses or tenderness. Bowel sounds active in all quadrants.. No liver or spleen enlargement or tenderness.. Genitourinary (GU) Bladder without fullness, masses or tenderness.Marland Kitchen Psychiatric No evidence of depression, anxiety, or agitation. Calm, cooperative, and communicative. Appropriate interactions and affect.. General Notes: when exam; lower pelvic horizontal C-section scar. Larger one on the right smaller to the left these can neck there is no major drainage no surrounding erythema and no tenderness. Integumentary (Hair, Skin) no systemic skin issues are seen. Wound #1 status is Open. Original cause of wound was Surgical Injury. The wound is located on the Right Abdomen - Lower Quadrant. The wound measures 0.5cm length x 2.4cm width x 2cm depth; 0.942cm^2 area and 1.885cm^3 volume. There is muscle exposed. There is no undermining noted,  however, there is tunneling at 3:00 with a maximum distance of 3cm. There is a large amount of serosanguineous drainage noted. The wound margin is distinct with the outline attached to the wound base. There is large (67-100%) red, pink granulation within the wound bed. There is a small (1-33%) amount of necrotic tissue within the wound bed including Adherent Slough. The periwound skin appearance exhibited: Erythema. The periwound skin appearance did not exhibit: Callus, Crepitus, Excoriation, Induration, Rash, Scarring, Dry/Scaly, Maceration, Atrophie Blanche, Cyanosis, Ecchymosis, Hemosiderin Staining, Mottled, Pallor, Rubor. The surrounding wound skin color is noted with erythema which is circumferential. Wound #2 status is Open. Original cause of wound was Surgical Injury. The wound is located on the Left Abdomen - Lower Quadrant. The wound measures 0.3cm length x 0.1cm width x 1.3cm depth; 0.024cm^2 area and 0.031cm^3 volume. There is Fat Layer (Subcutaneous Tissue) Exposed exposed. There is no undermining noted, however, there is tunneling at 9:00 with a maximum distance of 2cm. There is a large amount of serosanguineous drainage noted. The wound margin is indistinct and nonvisible. There is large (67-100%) red, pink granulation within the wound bed. There is no necrotic tissue within the wound bed. The periwound skin appearance had no abnormalities noted for texture. The periwound skin appearance had no abnormalities noted for moisture. The periwound skin appearance did not exhibit: Atrophie Blanche, Cyanosis, Ecchymosis, Hemosiderin Staining, Mottled, Pallor, Rubor, Erythema. Periwound temperature was noted as No Abnormality. Assessment Active Problems ICD-10 MISHTI, SWANTON. ([id number]) Disruption of external operation (surgical) wound, not elsewhere classified, initial encounter Non-pressure chronic ulcer of skin of other sites with fat layer exposed Plan Wound Cleansing: Wound #1  Right Abdomen - Lower Quadrant: Clean wound with Normal Saline. May Shower, gently pat wound dry prior to applying new dressing. Wound #2 Left Abdomen - Lower Quadrant: Clean wound with Normal Saline. May Shower, gently pat wound dry prior to applying new dressing. Anesthetic (add to Medication List): Wound #1 Right Abdomen - Lower Quadrant: Topical Lidocaine 4%  cream applied to wound bed prior to debridement (In Clinic Only). Wound #2 Left Abdomen - Lower Quadrant: Topical Lidocaine 4% cream applied to wound bed prior to debridement (In Clinic Only). Skin Barriers/Peri-Wound Care: Wound #1 Right Abdomen - Lower Quadrant: Skin Prep Wound #2 Left Abdomen - Lower Quadrant: Skin Prep Dressing Change Frequency: Wound #1 Right Abdomen - Lower Quadrant: Change Dressing Monday, Wednesday, Friday Wound #2 Left Abdomen - Lower Quadrant: Change Dressing Monday, Wednesday, Friday Follow-up Appointments: Wound #1 Right Abdomen - Lower Quadrant: Return Appointment in 1 week. Nurse Visit as needed - Friday Wound #2 Left Abdomen - Lower Quadrant: Return Appointment in 1 week. Negative Pressure Wound Therapy: Wound #1 Right Abdomen - Lower Quadrant: Wound VAC settings at 125/130 mmHg continuous pressure. Use BLACK/GREEN foam to wound cavity. Use WHITE foam to fill any tunnel/s and/or undermining. Change VAC dressing 2 X WEEK. Change canister as indicated when full. Nurse may titrate settings and frequency of dressing changes as clinically indicated. Home Health Nurse may d/c VAC for s/s of increased infection, significant wound regression, or uncontrolled drainage. [facility] at [phone]. Number of foam/gauze pieces used in the dressing = - 2 Wound #2 Left Abdomen - Lower Quadrant: Wound VAC settings at 125/130 mmHg continuous pressure. Use BLACK/GREEN foam to wound cavity. Use WHITE foam to fill any tunnel/s and/or undermining. Change VAC dressing 2 X WEEK. Change canister  as indicated when full. Nurse may titrate settings and frequency of dressing changes as clinically indicated. Home Health Nurse may d/c VAC for s/s of increased infection, significant wound regression, or uncontrolled drainage. [facility] at [phone]. Number of foam/gauze pieces used in the dressing = - 2 Medical Decision Making Disruption of external operation (surgical) wound, not elsewhere classified, initial encounter 11/06/2017 Status: No change Complications: surgical site at the previous cesarean section. 2 wound areas. Continued undermining in both of these areas and indeed I was able to show that these connect.. Interventions: Ultrasound was negative for an abscess or fluid collection Culture for methicillin sensitive staph MARYKATHLEEN, RUSSI. ([id number]) aureus she just started antibiotics yesterday Non-pressure chronic ulcer of skin of other sites with fat layer exposed 11/06/2017 Status: No change Complications: surgical wound site at previous cesarean section Interventions: discharge with a standard KCI wound VAC #1 she is completing her antibiotics for MSSA #2 wound VAC. Careful assessment of this next week Electronic Signature(s) Signed: 12/13/2017 8:05:08 AM By: Baltazar Najjar MD Entered By: Baltazar Najjar on 12/12/2017 21:51:06 Theresa Finner ([id number]) -------------------------------------------------------------------------------- SuperBill Details Patient Name: Theresa Finner Date of Service: 12/06/2017 Medical Record Number: [id number] Patient Account Number: [id number] Date of Birth/Sex: 1995-08-05 (21 y.o. F) Treating RN: Huel Coventry Primary Care Provider: Arville Care Other Clinician: Referring Provider: Arville Care Treating Provider/Extender: Maxwell Caul Weeks in Treatment: 4 Diagnosis Coding ICD-10 Codes Code Description T81.31XA Disruption of external operation (surgical) wound, not elsewhere classified, initial  encounter L98.492 Non-pressure chronic ulcer of skin of other sites with fat layer exposed Facility Procedures CPT4 Code: 88416606 Description: 30160 - WOUND VAC-50 SQ CM OR LESS Modifier: Quantity: 1 Physician Procedures CPT4: Description Modifier Quantity Code 1093235 99213 - WC PHYS LEVEL 3 - EST PT 1 ICD-10 Diagnosis Description T81.31XA Disruption of external operation (surgical) wound, not elsewhere classified, initial encounter L98.492 Non-pressure chronic ulcer of  skin of other sites with fat layer exposed Electronic Signature(s) Signed: 12/13/2017 8:05:08 AM By: Baltazar Najjar MD Entered By: Baltazar Najjar on 12/12/2017 21:51:33

## 2017-12-25 DIAGNOSIS — T8189XA Other complications of procedures, not elsewhere classified, initial encounter: Secondary | ICD-10-CM | POA: Diagnosis not present

## 2017-12-25 DIAGNOSIS — T8131XA Disruption of external operation (surgical) wound, not elsewhere classified, initial encounter: Secondary | ICD-10-CM | POA: Diagnosis not present

## 2017-12-26 ENCOUNTER — Encounter: Payer: BLUE CROSS/BLUE SHIELD | Admitting: Nurse Practitioner

## 2017-12-26 DIAGNOSIS — T8131XA Disruption of external operation (surgical) wound, not elsewhere classified, initial encounter: Secondary | ICD-10-CM | POA: Diagnosis not present

## 2017-12-26 DIAGNOSIS — T8189XA Other complications of procedures, not elsewhere classified, initial encounter: Secondary | ICD-10-CM | POA: Diagnosis not present

## 2017-12-26 DIAGNOSIS — L98492 Non-pressure chronic ulcer of skin of other sites with fat layer exposed: Secondary | ICD-10-CM | POA: Diagnosis not present

## 2017-12-26 DIAGNOSIS — T8131XS Disruption of external operation (surgical) wound, not elsewhere classified, sequela: Secondary | ICD-10-CM | POA: Diagnosis not present

## 2017-12-27 NOTE — Progress Notes (Signed)
HAYLEY, HORN ([id number]) Visit Report for 12/18/2017 Arrival Information Details Patient Name: Theresa, Hansen Date of Service: 12/18/2017 3:45 PM Medical Record Number: [id number] Patient Account Number: [id number] Date of Birth/Sex: 05/28/1995 (21 y.o. F) Treating RN: Phillis Haggis Primary Care Jalayah Gutridge: Arville Care Other Clinician: Referring Hero Kulish: Arville Care Treating Talea Manges/Extender: Linwood Dibbles, HOYT Weeks in Treatment: 6 Visit Information History Since Last Visit All ordered tests and consults were completed: No Patient Arrived: Ambulatory Added or deleted any medications: No Arrival Time: 16:09 Any new allergies or adverse reactions: No Accompanied By: mother Had a fall or experienced change in No Transfer Assistance: None activities of daily living that may affect Patient Identification Verified: Yes risk of falls: Secondary Verification Process Completed: Yes Signs or symptoms of abuse/neglect since last visito No Patient Requires Transmission-Based No Hospitalized since last visit: No Precautions: Implantable device outside of the clinic excluding No Patient Has Alerts: No cellular tissue based products placed in the center since last visit: Has Dressing in Place as Prescribed: Yes Pain Present Now: No Electronic Signature(s) Signed: 12/18/2017 4:59:33 PM By: Alejandro Mulling Entered By: Alejandro Mulling on 12/18/2017 16:10:51 Theresa Hansen ([id number]) -------------------------------------------------------------------------------- Clinic Level of Care Assessment Details Patient Name: Theresa Hansen Date of Service: 12/18/2017 3:45 PM Medical Record Number: [id number] Patient Account Number: [id number] Date of Birth/Sex: Feb 09, 1996 (21 y.o. F) Treating RN: Phillis Haggis Primary Care Marcelia Petersen: Arville Care Other Clinician: Referring Deyonna Fitzsimmons: Arville Care Treating Tamyka Bezio/Extender: Linwood Dibbles, HOYT Weeks in Treatment:  6 Clinic Level of Care Assessment Items TOOL 4 Quantity Score X - Use when only an EandM is performed on FOLLOW-UP visit 1 0 ASSESSMENTS - Nursing Assessment / Reassessment X - Reassessment of Co-morbidities (includes updates in patient status) 1 10 X- 1 5 Reassessment of Adherence to Treatment Plan ASSESSMENTS - Wound and Skin Assessment / Reassessment []  - Simple Wound Assessment / Reassessment - one wound 0 X- 2 5 Complex Wound Assessment / Reassessment - multiple wounds []  - 0 Dermatologic / Skin Assessment (not related to wound area) ASSESSMENTS - Focused Assessment []  - Circumferential Edema Measurements - multi extremities 0 []  - 0 Nutritional Assessment / Counseling / Intervention []  - 0 Lower Extremity Assessment (monofilament, tuning fork, pulses) []  - 0 Peripheral Arterial Disease Assessment (using hand held doppler) ASSESSMENTS - Ostomy and/or Continence Assessment and Care []  - Incontinence Assessment and Management 0 []  - 0 Ostomy Care Assessment and Management (repouching, etc.) PROCESS - Coordination of Care X - Simple Patient / Family Education for ongoing care 1 15 []  - 0 Complex (extensive) Patient / Family Education for ongoing care []  - 0 Staff obtains Chiropractor, Records, Test Results / Process Orders []  - 0 Staff telephones HHA, Nursing Homes / Clarify orders / etc []  - 0 Routine Transfer to another Facility (non-emergent condition) []  - 0 Routine Hospital Admission (non-emergent condition) []  - 0 New Admissions / Manufacturing engineer / Ordering NPWT, Apligraf, etc. []  - 0 Emergency Hospital Admission (emergent condition) X- 1 10 Simple Discharge Coordination Theresa Hansen, Theresa Hansen. ([id number]) []  - 0 Complex (extensive) Discharge Coordination PROCESS - Special Needs []  - Pediatric / Minor Patient Management 0 []  - 0 Isolation Patient Management []  - 0 Hearing / Language / Visual special needs []  - 0 Assessment of Community assistance  (transportation, D/C planning, etc.) []  - 0 Additional assistance / Altered mentation []  - 0 Support Surface(s) Assessment (bed, cushion, seat, etc.) INTERVENTIONS - Wound Cleansing / Measurement []  - Simple Wound Cleansing - one  wound 0 X- 2 5 Complex Wound Cleansing - multiple wounds X- 1 5 Wound Imaging (photographs - any number of wounds) []  - 0 Wound Tracing (instead of photographs) []  - 0 Simple Wound Measurement - one wound X- 2 5 Complex Wound Measurement - multiple wounds INTERVENTIONS - Wound Dressings X - Small Wound Dressing one or multiple wounds 2 10 []  - 0 Medium Wound Dressing one or multiple wounds []  - 0 Large Wound Dressing one or multiple wounds []  - 0 Application of Medications - topical []  - 0 Application of Medications - injection INTERVENTIONS - Miscellaneous []  - External ear exam 0 []  - 0 Specimen Collection (cultures, biopsies, blood, body fluids, etc.) []  - 0 Specimen(s) / Culture(s) sent or taken to Lab for analysis []  - 0 Patient Transfer (multiple staff / Nurse, adultHoyer Lift / Similar devices) []  - 0 Simple Staple / Suture removal (25 or less) []  - 0 Complex Staple / Suture removal (26 or more) []  - 0 Hypo / Hyperglycemic Management (close monitor of Blood Glucose) []  - 0 Ankle / Brachial Index (ABI) - do not check if billed separately []  - 0 Vital Signs Theresa Hansen, Theresa M. ([id number]) Has the patient been seen at the hospital within the last three years: Yes Total Score: 95 Level Of Care: New/Established - Level 3 Electronic Signature(s) Signed: 12/18/2017 4:59:33 PM By: Alejandro MullingPinkerton, Debra Entered By: Alejandro MullingPinkerton, Debra on 12/18/2017 16:51:08 Theresa FinnerBULLINS, Theresa M. ([id number]) -------------------------------------------------------------------------------- Encounter Discharge Information Details Patient Name: Theresa FinnerBULLINS, Theresa M. Date of Service: 12/18/2017 3:45 PM Medical Record Number: [id number] Patient Account Number: [id number] Date of Birth/Sex:  07-15-1995 (21 y.o. F) Treating RN: Phillis HaggisPinkerton, Debi Primary Care Roena Sassaman: Arville CareETTINGER, JOSHUA Other Clinician: Referring Izola Teague: Arville CareETTINGER, JOSHUA Treating Curry Dulski/Extender: Linwood DibblesSTONE III, HOYT Weeks in Treatment: 6 Encounter Discharge Information Items Discharge Condition: Stable Ambulatory Status: Ambulatory Discharge Destination: Home Transportation: Private Auto Accompanied By: mother Schedule Follow-up Appointment: Yes Clinical Summary of Care: Electronic Signature(s) Signed: 12/18/2017 4:59:33 PM By: Alejandro MullingPinkerton, Debra Entered By: Alejandro MullingPinkerton, Debra on 12/18/2017 16:50:19 Theresa FinnerBULLINS, Theresa M. ([id number]) -------------------------------------------------------------------------------- Patient/Caregiver Education Details Patient Name: Theresa FinnerBULLINS, Theresa M. Date of Service: 12/18/2017 3:45 PM Medical Record Number: [id number] Patient Account Number: [id number] Date of Birth/Gender: 07-15-1995 (21 y.o. F) Treating RN: Phillis HaggisPinkerton, Debi Primary Care Physician: Arville CareETTINGER, JOSHUA Other Clinician: Referring Physician: Arville CareETTINGER, JOSHUA Treating Physician/Extender: Skeet SimmerSTONE III, HOYT Weeks in Treatment: 6 Education Assessment Education Provided To: Patient Education Topics Provided Wound/Skin Impairment: Handouts: Caring for Your Ulcer, Skin Care Do's and Dont's, Other: change dressing as ordered Methods: Demonstration, Explain/Verbal Responses: State content correctly Electronic Signature(s) Signed: 12/18/2017 4:59:33 PM By: Alejandro MullingPinkerton, Debra Entered By: Alejandro MullingPinkerton, Debra on 12/18/2017 16:50:06 Theresa FinnerBULLINS, Theresa M. ([id number]) -------------------------------------------------------------------------------- Wound Assessment Details Patient Name: Theresa FinnerBULLINS, Merari M. Date of Service: 12/18/2017 3:45 PM Medical Record Number: [id number] Patient Account Number: [id number] Date of Birth/Sex: 07-15-1995 (21 y.o. F) Treating RN: Phillis HaggisPinkerton, Debi Primary Care Isidoro Santillana: Arville CareETTINGER, JOSHUA Other  Clinician: Referring Roxie Gueye: Arville CareETTINGER, JOSHUA Treating Murel Shenberger/Extender: Linwood DibblesSTONE III, HOYT Weeks in Treatment: 6 Wound Status Wound Number: 1 Primary Etiology: Dehisced Wound Wound Location: Right Abdomen - Lower Quadrant Wound Status: Open Wounding Event: Surgical Injury Date Acquired: 09/03/2017 Weeks Of Treatment: 6 Clustered Wound: No Wound Measurements Length: (cm) 2 % Reduct Width: (cm) 0.8 % Reduct Depth: (cm) 2.3 Epitheli Area: (cm) 1.257 Tunneli Volume: (cm) 2.89 Posi Maxim ion in Area: 38.4% ion in Volume: -8.9% alization: None ng: Yes tion (o'clock): 3 um Distance: (cm) 2.7 Undermining: No Wound Description Full Thickness With Exposed Support Foul Od  Classification: Structures Slough/ Wound Margin: Distinct, outline attached Exudate None Present Amount: or After Cleansing: No Fibrino No Wound Bed Granulation Amount: Large (67-100%) Exposed Structure Granulation Quality: Red Fascia Exposed: No Necrotic Amount: None Present (0%) Fat Layer (Subcutaneous Tissue) Exposed: No Tendon Exposed: No Muscle Exposed: No Joint Exposed: No Bone Exposed: No Periwound Skin Texture Texture Color No Abnormalities Noted: No No Abnormalities Noted: No Moisture Temperature / Pain No Abnormalities Noted: No Temperature: No Abnormality Tenderness on Palpation: Yes Wound Preparation Ulcer Cleansing: Rinsed/Irrigated with Saline Topical Anesthetic Applied: None Woodroof, Theresa Judie PetitM. ([id number]) Treatment Notes Wound #1 (Right Abdomen - Lower Quadrant) 1. Cleansed with: Clean wound with Normal Saline 2. Anesthetic Topical Lidocaine 4% cream to wound bed prior to debridement 3. Peri-wound Care: Antifungal cream Skin Prep 4. Dressing Applied: Prisma Ag Iodoform packing Gauze 5. Secondary Dressing Applied ABD Pad 7. Secured with Secretary/administratorTape Electronic Signature(s) Signed: 12/18/2017 4:59:33 PM By: Alejandro MullingPinkerton, Debra Entered By: Alejandro MullingPinkerton, Debra on 12/18/2017  16:34:16 Theresa FinnerBULLINS, Theresa M. ([id number]) -------------------------------------------------------------------------------- Wound Assessment Details Patient Name: Theresa FinnerBULLINS, Theresa M. Date of Service: 12/18/2017 3:45 PM Medical Record Number: [id number] Patient Account Number: [id number] Date of Birth/Sex: 1995-06-10 (21 y.o. F) Treating RN: Phillis HaggisPinkerton, Debi Primary Care Wanette Robison: Arville CareETTINGER, JOSHUA Other Clinician: Referring Kayvon Mo: Arville CareETTINGER, JOSHUA Treating Jovaun Levene/Extender: Linwood DibblesSTONE III, HOYT Weeks in Treatment: 6 Wound Status Wound Number: 2 Primary Etiology: Dehisced Wound Wound Location: Left Abdomen - Lower Quadrant Wound Status: Open Wounding Event: Surgical Injury Date Acquired: 09/03/2017 Weeks Of Treatment: 6 Clustered Wound: No Wound Measurements Length: (cm) 0.1 % Reduc Width: (cm) 0.1 % Reduc Depth: (cm) 3.2 Epithel Area: (cm) 0.008 Tunnel Volume: (cm) 0.025 Pos Maxi tion in Area: 92.7% tion in Volume: 62.1% ialization: None ing: Yes ition (o'clock): 9 mum Distance: (cm) 3.6 Undermining: No Wound Description Full Thickness Without Exposed Support Foul O Classification: Structures Slough Wound Margin: Distinct, outline attached Exudate Large Amount: Exudate Type: Sanguinous Exudate Color: red dor After Cleansing: No /Fibrino No Wound Bed Granulation Amount: Large (67-100%) Exposed Structure Granulation Quality: Red Fascia Exposed: No Necrotic Amount: None Present (0%) Fat Layer (Subcutaneous Tissue) Exposed: No Tendon Exposed: No Muscle Exposed: No Joint Exposed: No Bone Exposed: No Periwound Skin Texture Texture Color No Abnormalities Noted: No No Abnormalities Noted: No Scarring: Yes Temperature / Pain Moisture Temperature: No Abnormality No Abnormalities Noted: No Tenderness on Palpation: Yes Wound Preparation Ulcer Cleansing: Rinsed/Irrigated with Saline Kina, Joua M. ([id number]) Topical Anesthetic Applied: None Treatment  Notes Wound #2 (Left Abdomen - Lower Quadrant) 1. Cleansed with: Clean wound with Normal Saline 2. Anesthetic Topical Lidocaine 4% cream to wound bed prior to debridement 3. Peri-wound Care: Antifungal cream Skin Prep 4. Dressing Applied: Prisma Ag Iodoform packing Gauze 5. Secondary Dressing Applied ABD Pad 7. Secured with Secretary/administratorTape Electronic Signature(s) Signed: 12/18/2017 4:59:33 PM By: Alejandro MullingPinkerton, Debra Entered By: Alejandro MullingPinkerton, Debra on 12/18/2017 16:26:09

## 2017-12-27 NOTE — Progress Notes (Signed)
Theresa Hansen, Theresa Hansen ([id number]) Visit Report for 12/13/2017 Arrival Information Details Patient Name: Theresa Hansen Date of Service: 12/13/2017 3:15 PM Medical Record Number: [id number] Patient Account Number: [id number] Date of Birth/Sex: 1996-03-09 (23 y.o. F) Treating RN: Theresa Hansen Primary Care Theresa Hansen: Theresa Hansen Other Clinician: Referring Theresa Hansen: Theresa Hansen Treating Theresa Hansen/Extender: Theresa Hansen in Treatment: 5 Visit Information History Since Last Visit All ordered tests and consults were completed: No Patient Arrived: Ambulatory Added or deleted any medications: No Arrival Time: 15:49 Any new allergies or adverse reactions: No Accompanied By: mother Had a fall or experienced change in No Transfer Assistance: None activities of daily living that may affect Patient Identification Verified: Yes risk of falls: Secondary Verification Process Completed: Yes Signs or symptoms of abuse/neglect since last visito No Patient Requires Transmission-Based No Hospitalized since last visit: No Precautions: Implantable device outside of the clinic excluding No Patient Has Alerts: No cellular tissue based products placed in the center since last visit: Has Dressing in Place as Prescribed: Yes Pain Present Now: No Electronic Signature(s) Signed: 12/18/2017 4:59:33 PM By: Theresa Hansen Entered By: Theresa Hansen on 12/13/2017 15:50:21 Theresa Hansen ([id number]) -------------------------------------------------------------------------------- Encounter Discharge Information Details Patient Name: Theresa Hansen Date of Service: 12/13/2017 3:15 PM Medical Record Number: [id number] Patient Account Number: [id number] Date of Birth/Sex: 06-24-1995 (22 y.o. F) Treating RN: Theresa Hansen Primary Care Theresa Hansen: Theresa Hansen Other Clinician: Referring Theresa Hansen: Theresa Hansen Treating Kerina Simoneau/Extender: Theresa Hansen in  Treatment: 5 Encounter Discharge Information Items Discharge Condition: Stable Ambulatory Status: Ambulatory Discharge Destination: Home Transportation: Private Auto Accompanied By: mother Schedule Follow-up Appointment: Yes Clinical Summary of Care: Electronic Signature(s) Signed: 12/13/2017 5:08:28 PM By: Theresa Hansen Entered By: Theresa Hansen on 12/13/2017 17:08:27 Theresa Hansen ([id number]) -------------------------------------------------------------------------------- Lower Extremity Assessment Details Patient Name: Theresa Hansen Date of Service: 12/13/2017 3:15 PM Medical Record Number: [id number] Patient Account Number: [id number] Date of Birth/Sex: [birth year] (22 y.o. F) Treating RN: Theresa Hansen Primary Care Theresa Hansen: Theresa Hansen Other Clinician: Referring Elania Crowl: Theresa Hansen Treating Magaby Rumberger/Extender: Theresa Hansen in Treatment: 5 Electronic Signature(s) Signed: 12/18/2017 4:59:33 PM By: Theresa Hansen Entered By: Theresa Hansen on 12/13/2017 15:55:05 Theresa Hansen ([id number]) -------------------------------------------------------------------------------- Multi Wound Chart Details Patient Name: Theresa Hansen Date of Service: 12/13/2017 3:15 PM Medical Record Number: [id number] Patient Account Number: [id number] Date of Birth/Sex: [birth year] (22 y.o. F) Treating RN: Theresa Hansen Primary Care Maurisa Tesmer: Theresa Hansen Other Clinician: Referring Rylon Poitra: Theresa Hansen Treating Lanetra Hartley/Extender: Theresa Hansen in Treatment: 5 Vital Signs Height(in): 64 Pulse(bpm): 88 Weight(lbs): 149 Blood Pressure(mmHg): 100/50 Body Mass Index(BMI): 26 Temperature(F): 98.3 Respiratory Rate 18 (breaths/min): Photos: [N/A:N/A] Wound Location: Right Abdomen - Lower Left Abdomen - Lower N/A Quadrant Quadrant Wounding Event: Surgical Injury Surgical Injury N/A Primary Etiology: Dehisced Wound Dehisced Wound  N/A Date Acquired: 09/03/2017 09/03/2017 N/A Hansen of Treatment: 5 5 N/A Wound Status: Open Open N/A Measurements L x W x D 2x0.5x1.5 0.5x0.2x0.1 N/A (cm) Area (cm) : 0.785 0.079 N/A Volume (cm) : 1.178 0.008 N/A % Reduction in Area: 61.60% 28.20% N/A % Reduction in Volume: 55.60% 87.90% N/A Starting Position 1 3 (o'clock): Ending Position 1 4 (o'clock): Maximum Distance 1 (cm): 3.4 Undermining: Yes No N/A Classification: Full Thickness With Exposed Full Thickness Without N/A Support Structures Exposed Support Structures Exudate Amount: Small Small N/A Exudate Type: Serosanguineous Serosanguineous N/A Exudate Color: red, brown red, brown N/A Wound Margin: Distinct, outline attached Distinct, outline attached N/A Granulation Amount: Large (67-100%) Large (  67-100%) N/A Granulation Quality: Red Red N/A Necrotic Amount: None Present (0%) None Present (0%) N/A Exposed Structures: N/A Theresa Hansen, Theresa Hansen ([id number]) Fascia: No Fascia: No Fat Layer (Subcutaneous Fat Layer (Subcutaneous Tissue) Exposed: No Tissue) Exposed: No Tendon: No Tendon: No Muscle: No Muscle: No Joint: No Joint: No Bone: No Bone: No Epithelialization: N/A None N/A Periwound Skin Texture: No Abnormalities Noted Scarring: Yes N/A Periwound Skin Moisture: No Abnormalities Noted No Abnormalities Noted N/A Periwound Skin Color: No Abnormalities Noted No Abnormalities Noted N/A Temperature: No Abnormality No Abnormality N/A Tenderness on Palpation: Yes Yes N/A Wound Preparation: Ulcer Cleansing: Ulcer Cleansing: N/A Rinsed/Irrigated with Saline Rinsed/Irrigated with Saline Topical Anesthetic Applied: Topical Anesthetic Applied: Other: lidocaine 4% Other: lidocaine 4% Procedures Performed: Negative Pressure Wound N/A N/A Therapy Maintenance (NPWT) Treatment Notes Wound #1 (Right Abdomen - Lower Quadrant) 1. Cleansed with: Clean wound with Normal Saline 2. Anesthetic Topical Lidocaine 4% cream to  wound bed prior to debridement 3. Peri-wound Care: Antifungal powder Skin Prep 8. Negative Pressure Wound Therapy Wound Vac to wound continuously at 150m/hg pressure Black Foam Number of foam/gauze pieces used in the dressing = Change canister as needed. Wound #2 (Left Abdomen - Lower Quadrant) 1. Cleansed with: Clean wound with Normal Saline 2. Anesthetic Topical Lidocaine 4% cream to wound bed prior to debridement 3. Peri-wound Care: Antifungal powder Skin Prep 8. Negative Pressure Wound Therapy Wound Vac to wound continuously at 1261mhg pressure Black Foam Number of foam/gauze pieces used in the dressing = Change canister as needed. Electronic Signature(s) Signed: 12/13/2017 6:17:43 PM By: RoLinton HamD Entered By: RoLinton Hamn 12/13/2017 17:47:42 Theresa [id number], [id number]-------------------------------------------------------------------------------- Multi-Disciplinary Care Plan Details Patient Name: Theresa Hansen of Service: 12/13/2017 3:15 PM Medical Record Number: [id number] Account Number: [id number] of Birth/Sex: [birth year].o. F) Treating RN: WoCornell Barmanrimary Care Gavyn Ybarra: DECaryl Pinather Clinician: Referring Keil Pickering: DECaryl Pinareating Reginna Sermeno/Extender: ROTito Dinen Treatment: 5 Active Inactive ` Orientation to the Wound Care Program Nursing Diagnoses: Knowledge deficit related to the wound healing center program Goals: Patient/caregiver will verbalize understanding of the [facility] Date Initiated: 11/03/2017 Target Resolution Date: 01/20/2018 Goal Status: Active Interventions: Provide education on orientation to the wound center Notes: ` Pain, Acute or Chronic Nursing Diagnoses: Potential alteration in comfort, pain Goals: Patient/caregiver will verbalize comfort level met Date Initiated: 12/11/2017 Target Resolution Date:  12/06/2017 Goal Status: Active Interventions: Complete pain assessment as per visit requirements Treatment Activities: Administer pain control measures as ordered : 12/06/2017 Notes: ` Wound/Skin Impairment Nursing Diagnoses: Impaired tissue integrity Goals: Ulcer/skin breakdown will heal within 14 Hansen Date Initiated: 11/03/2017 Target Resolution Date: 01/20/2018 Theresa Hansen, [id number] Status: Active Interventions: Assess patient/caregiver ability to obtain necessary supplies Assess patient/caregiver ability to perform ulcer/skin care regimen upon admission and as needed Assess ulceration(s) every visit Notes: Electronic Signature(s) Signed: 12/15/2017 6:17:48 PM By: WoGretta CoolBSN, RN, CWS, Kim RN, BSN Entered By: WoGretta CoolBSN, RN, CWS, Kim on 12/13/2017 16:21:36 Theresa [id number]-------------------------------------------------------------------------------- Negative Pressure Wound Therapy Maintenance (NPWT) Details Patient Name: Theresa Hansen of Service: 12/13/2017 3:15 PM Medical Record Number: [id number] Account Number: [id number] of Birth/Sex: [birth year].o. F) Treating RN: WoCornell Barmanrimary Care Tawnee Clegg: DECaryl Pinather Clinician: Referring Karlissa Aron: DECaryl Pinareating Makynzi Eastland/Extender: RORicard Dilloneeks in Treatment: 5 NPWT Maintenance Performed for: Wound #1 Right Abdomen - Lower Quadrant Additional Injuries Covered: Yes Performed  By: Theresa Barman, RN Type: Other Coverage Size (sq cm): 1 Pressure Type: Constant Pressure Setting: 125 mmHG Drain Type: None Sponge/Dressing Type: Foam, Black Date Initiated: 12/06/2017 Dressing Removed: No Quantity of Sponges/Gauze Removed: 3 Canister Changed: No Dressing Reapplied: No Quantity of Sponges/Gauze Inserted: 3 Days On NPWT: 8 Post Procedure Diagnosis Same as Pre-procedure Electronic Signature(s) Signed: 12/15/2017 6:17:48 PM By: Gretta Cool, BSN, RN, CWS,  Kim RN, BSN Entered By: Gretta Cool, BSN, RN, CWS, Kim on 12/13/2017 16:22:56 Theresa Hansen ([id number]) -------------------------------------------------------------------------------- Pain Assessment Details Patient Name: Theresa Hansen Date of Service: 12/13/2017 3:15 PM Medical Record Number: [id number] Patient Account Number: [id number] Date of Birth/Sex: [birth year] (22 y.o. F) Treating RN: Theresa Hansen Primary Care Tawnya Pujol: Theresa Hansen Other Clinician: Referring Heliodoro Domagalski: Theresa Hansen Treating Brunetta Newingham/Extender: Theresa Hansen in Treatment: 5 Active Problems Location of Pain Severity and Description of Pain Patient Has Paino No Site Locations Pain Management and Medication Current Pain Management: Electronic Signature(s) Signed: 12/18/2017 4:59:33 PM By: Theresa Hansen Entered By: Theresa Hansen on 12/13/2017 15:50:28 Theresa Hansen ([id number]) -------------------------------------------------------------------------------- Patient/Caregiver Education Details Patient Name: Theresa Hansen Date of Service: 12/13/2017 3:15 PM Medical Record Number: [id number] Patient Account Number: [id number] Date of Birth/Gender: 30-May-1995 (22 y.o. F) Treating RN: Theresa Hansen Primary Care Physician: Theresa Hansen Other Clinician: Referring Physician: Caryl Hansen Treating Physician/Extender: Theresa Hansen in Treatment: 5 Education Assessment Education Provided To: Patient and Caregiver Education Topics Provided Wound/Skin Impairment: Handouts: Other: wound care if needed Methods: Explain/Verbal Responses: State content correctly Electronic Signature(s) Signed: 12/13/2017 5:29:24 PM By: Theresa Hansen Entered By: Theresa Hansen on 12/13/2017 17:08:49 Theresa Hansen ([id number]) -------------------------------------------------------------------------------- Wound Assessment Details Patient Name: Theresa Hansen Date of  Service: 12/13/2017 3:15 PM Medical Record Number: [id number] Patient Account Number: [id number] Date of Birth/Sex: [birth year] (22 y.o. F) Treating RN: Theresa Hansen Primary Care Nino Amano: Theresa Hansen Other Clinician: Referring Mason Burleigh: Theresa Hansen Treating Mayan Dolney/Extender: Theresa Hansen in Treatment: 5 Wound Status Wound Number: 1 Primary Etiology: Dehisced Wound Wound Location: Right Abdomen - Lower Quadrant Wound Status: Open Wounding Event: Surgical Injury Date Acquired: 09/03/2017 Hansen Of Treatment: 5 Clustered Wound: No Photos Photo Uploaded By: Roger Shelter on 12/13/2017 16:21:15 Wound Measurements Length: (cm) 2 Width: (cm) 0.5 Depth: (cm) 1.5 Area: (cm) 0.785 Volume: (cm) 1.178 % Reduction in Area: 61.6% % Reduction in Volume: 55.6% Tunneling: No Undermining: Yes Starting Position (o'clock): 3 Ending Position (o'clock): 4 Maximum Distance: (cm) 3.4 Wound Description Full Thickness With Exposed Support Classification: Structures Wound Margin: Distinct, outline attached Exudate Small Amount: Exudate Type: Serosanguineous Exudate Color: red, brown Foul Odor After Cleansing: No Slough/Fibrino No Wound Bed Granulation Amount: Large (67-100%) Exposed Structure Granulation Quality: Red Fascia Exposed: No Necrotic Amount: None Present (0%) Fat Layer (Subcutaneous Tissue) Exposed: No Tendon Exposed: No Theresa Hansen, Theresa M. ([id number]) Muscle Exposed: No Joint Exposed: No Bone Exposed: No Periwound Skin Texture Texture Color No Abnormalities Noted: No No Abnormalities Noted: No Moisture Temperature / Pain No Abnormalities Noted: No Temperature: No Abnormality Tenderness on Palpation: Yes Wound Preparation Ulcer Cleansing: Rinsed/Irrigated with Saline Topical Anesthetic Applied: Other: lidocaine 4%, Electronic Signature(s) Signed: 12/18/2017 4:59:33 PM By: Theresa Hansen Entered By: Theresa Hansen on 12/13/2017  15:54:03 Theresa Hansen ([id number]) -------------------------------------------------------------------------------- Wound Assessment Details Patient Name: Theresa Hansen Date of Service: 12/13/2017 3:15 PM Medical Record Number: [id number] Patient Account Number: [id number] Date of Birth/Sex: 1995-08-11 (22 y.o. F) Treating RN: Theresa Hansen Primary Care Aarya Quebedeaux: Theresa Hansen Other Clinician: Referring Mickal Meno: Warrick Parisian,  JOSHUA Treating Kaide Gage/Extender: Theresa Hansen in Treatment: 5 Wound Status Wound Number: 2 Primary Etiology: Dehisced Wound Wound Location: Left Abdomen - Lower Quadrant Wound Status: Open Wounding Event: Surgical Injury Date Acquired: 09/03/2017 Hansen Of Treatment: 5 Clustered Wound: No Photos Photo Uploaded By: Roger Shelter on 12/13/2017 16:21:16 Wound Measurements Length: (cm) 0.5 Width: (cm) 0.2 Depth: (cm) 0.1 Area: (cm) 0.079 Volume: (cm) 0.008 % Reduction in Area: 28.2% % Reduction in Volume: 87.9% Epithelialization: None Tunneling: No Undermining: No Wound Description Full Thickness Without Exposed Support Classification: Structures Wound Margin: Distinct, outline attached Exudate Small Amount: Exudate Type: Serosanguineous Exudate Color: red, brown Foul Odor After Cleansing: No Slough/Fibrino No Wound Bed Granulation Amount: Large (67-100%) Exposed Structure Granulation Quality: Red Fascia Exposed: No Necrotic Amount: None Present (0%) Fat Layer (Subcutaneous Tissue) Exposed: No Tendon Exposed: No Muscle Exposed: No Joint Exposed: No Bone Exposed: No Theresa Hansen, Theresa M. ([id number]) Periwound Skin Texture Texture Color No Abnormalities Noted: No No Abnormalities Noted: No Scarring: Yes Temperature / Pain Moisture Temperature: No Abnormality No Abnormalities Noted: No Tenderness on Palpation: Yes Wound Preparation Ulcer Cleansing: Rinsed/Irrigated with Saline Topical Anesthetic  Applied: Other: lidocaine 4%, Electronic Signature(s) Signed: 12/18/2017 4:59:33 PM By: Theresa Hansen Entered By: Theresa Hansen on 12/13/2017 15:54:50 Theresa Hansen ([id number]) -------------------------------------------------------------------------------- Vitals Details Patient Name: Theresa Hansen Date of Service: 12/13/2017 3:15 PM Medical Record Number: [id number] Patient Account Number: [id number] Date of Birth/Sex: June 04, 1995 (22 y.o. F) Treating RN: Theresa Hansen Primary Care Nia Nathaniel: Theresa Hansen Other Clinician: Referring Dejanay Wamboldt: Theresa Hansen Treating Marquitta Persichetti/Extender: Theresa Hansen in Treatment: 5 Vital Signs Time Taken: 15:50 Temperature (F): 98.3 Height (in): 64 Pulse (bpm): 88 Weight (lbs): 149 Respiratory Rate (breaths/min): 18 Body Mass Index (BMI): 25.6 Blood Pressure (mmHg): 100/50 Reference Range: 80 - 120 mg / dl Electronic Signature(s) Signed: 12/18/2017 4:59:33 PM By: Theresa Hansen Entered By: Theresa Hansen on 12/13/2017 15:50:52

## 2017-12-29 NOTE — Progress Notes (Signed)
Theresa Hansen, Samiya M. ([id number]) Visit Report for 12/15/2017 Arrival Information Details Patient Name: Theresa Hansen, Theresa M. Date of Service: 12/15/2017 1:00 PM Medical Record Number: [id number] Patient Account Number: [id number] Date of Birth/Sex: Apr 21, 1996 (21 y.o. F) Treating RN: Rema JasmineNg, Wendi Primary Care Jaiden Dinkins: Arville CareETTINGER, JOSHUA Other Clinician: Referring Steward Sames: Arville CareETTINGER, JOSHUA Treating Iolani Twilley/Extender: Linwood DibblesSTONE III, HOYT Weeks in Treatment: 6 Visit Information History Since Last Visit Added or deleted any medications: No Patient Arrived: Ambulatory Any new allergies or adverse reactions: No Arrival Time: 13:03 Had a fall or experienced change in No Accompanied By: fiance activities of daily living that may affect Transfer Assistance: None risk of falls: Patient Identification Verified: Yes Signs or symptoms of abuse/neglect since last visito No Secondary Verification Process Completed: Yes Hospitalized since last visit: No Patient Requires Transmission-Based No Implantable device outside of the clinic excluding No Precautions: cellular tissue based products placed in the center Patient Has Alerts: No since last visit: Has Dressing in Place as Prescribed: Yes Pain Present Now: No Electronic Signature(s) Signed: 12/15/2017 4:42:31 PM By: Rema JasmineNg, Wendi Entered By: Rema JasmineNg, Wendi on 12/15/2017 13:05:55 Theresa Hansen, Theresa M. ([id number]) -------------------------------------------------------------------------------- Encounter Discharge Information Details Patient Name: Theresa Hansen, Sherrine M. Date of Service: 12/15/2017 1:00 PM Medical Record Number: [id number] Patient Account Number: [id number] Date of Birth/Sex: Apr 21, 1996 (21 y.o. F) Treating RN: Huel CoventryWoody, Kim Primary Care Masato Pettie: Arville CareETTINGER, JOSHUA Other Clinician: Referring Matalynn Graff: Arville CareETTINGER, JOSHUA Treating Ringo Sherod/Extender: Linwood DibblesSTONE III, HOYT Weeks in Treatment: 6 Encounter Discharge Information Items Discharge Condition:  Stable Ambulatory Status: Ambulatory Discharge Destination: Home Transportation: Private Auto Schedule Follow-up Appointment: Yes Clinical Summary of Care: Electronic Signature(s) Signed: 12/18/2017 3:49:01 PM By: Elliot GurneyWoody, BSN, RN, CWS, Kim RN, BSN Entered By: Elliot GurneyWoody, BSN, RN, CWS, Kim on 12/18/2017 15:49:01 Theresa Hansen, Theresa M. ([id number]) -------------------------------------------------------------------------------- Negative Pressure Wound Therapy Maintenance (NPWT) Details Patient Name: Theresa Hansen, Theresa M. Date of Service: 12/15/2017 1:00 PM Medical Record Number: [id number] Patient Account Number: [id number] Date of Birth/Sex: Apr 21, 1996 (21 y.o. F) Treating RN: Rema JasmineNg, Wendi Primary Care Bera Pinela: Arville CareETTINGER, JOSHUA Other Clinician: Referring Ritta Hammes: Arville CareETTINGER, JOSHUA Treating Richar Dunklee/Extender: STONE III, HOYT Weeks in Treatment: 6 NPWT Maintenance Performed for: Wound #1 Right Abdomen - Lower Quadrant Additional Injuries Covered: Yes Performed By: Huel CoventryWoody, Kim, RN Type: Other Coverage Size (sq cm): 1.6 Pressure Type: Constant Pressure Setting: 125 mmHG Drain Type: None Sponge/Dressing Type: Foam, Black Date Initiated: 12/06/2017 Dressing Removed: Yes Canister Changed: No Dressing Reapplied: No Quantity of Sponges/Gauze Inserted: 2 Days On NPWT: 10 Electronic Signature(s) Signed: 12/15/2017 4:42:31 PM By: Rema JasmineNg, Wendi Entered By: Rema JasmineNg, Wendi on 12/15/2017 13:29:21 Theresa Hansen, Theresa M. ([id number]) -------------------------------------------------------------------------------- Patient/Caregiver Education Details Patient Name: Theresa Hansen, Theresa M. Date of Service: 12/15/2017 1:00 PM Medical Record Number: [id number] Patient Account Number: [id number] Date of Birth/Gender: Apr 21, 1996 (21 y.o. F) Treating RN: Huel CoventryWoody, Kim Primary Care Physician: Arville CareETTINGER, JOSHUA Other Clinician: Referring Physician: Arville CareETTINGER, JOSHUA Treating Physician/Extender: Skeet SimmerSTONE III, HOYT Weeks in Treatment: 6 Education  Assessment Education Provided To: Patient Education Topics Provided Wound/Skin Impairment: Handouts: Caring for Your Ulcer Methods: Demonstration, Explain/Verbal Responses: State content correctly Electronic Signature(s) Signed: 12/19/2017 3:42:18 PM By: Elliot GurneyWoody, BSN, RN, CWS, Kim RN, BSN Entered By: Elliot GurneyWoody, BSN, RN, CWS, Kim on 12/18/2017 15:48:51 Theresa Hansen, Theresa M. ([id number]) -------------------------------------------------------------------------------- Wound Assessment Details Patient Name: Theresa Hansen, Theresa M. Date of Service: 12/15/2017 1:00 PM Medical Record Number: [id number] Patient Account Number: [id number] Date of Birth/Sex: Apr 21, 1996 (21 y.o. F) Treating RN: Rema JasmineNg, Wendi Primary Care Murielle Stang: Arville CareETTINGER, JOSHUA Other Clinician: Referring Berlynn Warsame: Arville CareETTINGER, JOSHUA Treating Shykeem Resurreccion/Extender: STONE III, HOYT Weeks in Treatment: 6  Wound Status Wound Number: 1 Primary Etiology: Dehisced Wound Wound Location: Right Abdomen - Lower Quadrant Wound Status: Open Wounding Event: Surgical Injury Date Acquired: 09/03/2017 Weeks Of Treatment: 6 Clustered Wound: No Photos Photo Uploaded By: Rema JasmineNg, Wendi on 12/15/2017 16:18:32 Wound Measurements Length: (cm) 2 % Reduction Width: (cm) 0.8 % Reduction Depth: (cm) 2.3 Tunneling: Area: (cm) 1.257 Underminin Volume: (cm) 2.89 Startin Ending P Maximum in Area: 38.4% in Volume: -8.9% No g: Yes g Position (o'clock): 3 osition (o'clock): 3 Distance: (cm) 2.5 Wound Description Full Thickness With Exposed Support Foul Odor Classification: Structures Slough/Fib Wound Margin: Distinct, outline attached Exudate Small Amount: Exudate Type: Serosanguineous Exudate Color: red, brown After Cleansing: No rino No Wound Bed Granulation Amount: Large (67-100%) Exposed Structure Granulation Quality: Red Fascia Exposed: No Necrotic Amount: None Present (0%) Fat Layer (Subcutaneous Tissue) Exposed: No Tendon Exposed: No Issac,  Matilynn M. ([id number]) Muscle Exposed: No Joint Exposed: No Bone Exposed: No Periwound Skin Texture Texture Color No Abnormalities Noted: No No Abnormalities Noted: No Moisture Temperature / Pain No Abnormalities Noted: No Temperature: No Abnormality Tenderness on Palpation: Yes Wound Preparation Ulcer Cleansing: Rinsed/Irrigated with Saline Topical Anesthetic Applied: Other: lidocaine 4%, Electronic Signature(s) Signed: 12/15/2017 4:42:31 PM By: Rema JasmineNg, Wendi Entered By: Rema JasmineNg, Wendi on 12/15/2017 13:20:44 Theresa Hansen, Tiannah M. ([id number]) -------------------------------------------------------------------------------- Wound Assessment Details Patient Name: Theresa Hansen, Tocara M. Date of Service: 12/15/2017 1:00 PM Medical Record Number: [id number] Patient Account Number: [id number] Date of Birth/Sex: [birth year] (21 y.o. F) Treating RN: Rema JasmineNg, Wendi Primary Care Daymein Nunnery: Arville CareETTINGER, JOSHUA Other Clinician: Referring Vardaan Depascale: Arville CareETTINGER, JOSHUA Treating Elnathan Fulford/Extender: Linwood DibblesSTONE III, HOYT Weeks in Treatment: 6 Wound Status Wound Number: 2 Primary Etiology: Dehisced Wound Wound Location: Left Abdomen - Lower Quadrant Wound Status: Open Wounding Event: Surgical Injury Date Acquired: 09/03/2017 Weeks Of Treatment: 6 Clustered Wound: No Photos Photo Uploaded By: Rema JasmineNg, Wendi on 12/15/2017 16:18:34 Wound Measurements Length: (cm) 0.1 Width: (cm) 0.1 Depth: (cm) 0.1 Area: (cm) 0.008 Volume: (cm) 0.001 % Reduction in Area: 92.7% % Reduction in Volume: 98.5% Epithelialization: None Tunneling: Yes Position (o'clock): 9 Maximum Distance: (cm) 3.4 Undermining: No Wound Description Full Thickness Without Exposed Support Foul Odo Classification: Structures Slough/F Wound Margin: Distinct, outline attached Exudate Small Amount: Exudate Type: Serosanguineous Exudate Color: red, brown r After Cleansing: No ibrino No Wound Bed Granulation Amount: Large (67-100%) Exposed  Structure Granulation Quality: Red Fascia Exposed: No Necrotic Amount: None Present (0%) Fat Layer (Subcutaneous Tissue) Exposed: No Tendon Exposed: No Mankins, Verniece M. ([id number]) Muscle Exposed: No Joint Exposed: No Bone Exposed: No Periwound Skin Texture Texture Color No Abnormalities Noted: No No Abnormalities Noted: No Scarring: Yes Temperature / Pain Moisture Temperature: No Abnormality No Abnormalities Noted: No Tenderness on Palpation: Yes Wound Preparation Ulcer Cleansing: Rinsed/Irrigated with Saline Topical Anesthetic Applied: Other: lidocaine 4%, Electronic Signature(s) Signed: 12/15/2017 4:42:31 PM By: Rema JasmineNg, Wendi Entered By: Rema JasmineNg, Wendi on 12/15/2017 13:26:34

## 2018-01-02 ENCOUNTER — Ambulatory Visit: Payer: BLUE CROSS/BLUE SHIELD | Admitting: Physician Assistant

## 2018-01-07 NOTE — Progress Notes (Signed)
Theresa Hansen ([id number]) Visit Report for 12/26/2017 Arrival Information Details Patient Name: Theresa Hansen, Theresa Hansen Date of Service: 12/26/2017 3:45 PM Medical Record Number: [id number] Patient Account Number: [id number] Date of Birth/Sex: [birth year] (22 y.o. F) Treating RN: Montey Hora Primary Care Sherri Mcarthy: Caryl Pina Other Clinician: Referring Kinsie Belford: Caryl Pina Treating Shamariah Shewmake/Extender: Cathie Olden in Treatment: 7 Visit Information History Since Last Visit Added or deleted any medications: No Patient Arrived: Ambulatory Any new allergies or adverse reactions: No Arrival Time: 16:17 Had a fall or experienced change in No Accompanied By: mother activities of daily living that may affect Transfer Assistance: None risk of falls: Patient Identification Verified: Yes Signs or symptoms of abuse/neglect since last visito No Secondary Verification Process Completed: Yes Hospitalized since last visit: No Patient Requires Transmission-Based No Implantable device outside of the clinic excluding No Precautions: cellular tissue based products placed in the center Patient Has Alerts: No since last visit: Has Dressing in Place as Prescribed: Yes Pain Present Now: No Electronic Signature(s) Signed: 12/26/2017 4:48:53 PM By: Montey Hora Entered By: Montey Hora on 12/26/2017 16:18:29 Theresa Hansen ([id number]) -------------------------------------------------------------------------------- Clinic Level of Care Assessment Details Patient Name: Theresa Hansen Date of Service: 12/26/2017 3:45 PM Medical Record Number: [id number] Patient Account Number: [id number] Date of Birth/Sex: [birth year] (22 y.o. F) Treating RN: Cornell Barman Primary Care Kenzley Ke: Caryl Pina Other Clinician: Referring Atzin Buchta: Caryl Pina Treating Charvi Gammage/Extender: Cathie Olden in Treatment: 7 Clinic Level of Care Assessment Items TOOL 4 Quantity  Score _0  - Use when only an EandM is performed on FOLLOW-UP visit 0 ASSESSMENTS - Nursing Assessment / Reassessment _1  - Reassessment of Co-morbidities (includes updates in patient status) 0 X- 1 5 Reassessment of Adherence to Treatment Plan ASSESSMENTS - Wound and Skin Assessment / Reassessment X - Simple Wound Assessment / Reassessment - one wound 1 5 _2  - 0 Complex Wound Assessment / Reassessment - multiple wounds _3  - 0 Dermatologic / Skin Assessment (not related to wound area) ASSESSMENTS - Focused Assessment _4  - Circumferential Edema Measurements - multi extremities 0 _5  - 0 Nutritional Assessment / Counseling / Intervention _6  - 0 Lower Extremity Assessment (monofilament, tuning fork, pulses) _7  - 0 Peripheral Arterial Disease Assessment (using hand held doppler) ASSESSMENTS - Ostomy and/or Continence Assessment and Care _8  - Incontinence Assessment and Management 0 _9  - 0 Ostomy Care Assessment and Management (repouching, etc.) PROCESS - Coordination of Care X - Simple Patient / Family Education for ongoing care 1 15 _10  - 0 Complex (extensive) Patient / Family Education for ongoing care _11  - 0 Staff obtains Programmer, systems, Records, Test Results / Process Orders _12  - 0 Staff telephones HHA, Nursing Homes / Clarify orders / etc _13  - 0 Routine Transfer to another Facility (non-emergent condition) _14  - 0 Routine Hospital Admission (non-emergent condition) _15  - 0 New Admissions / Biomedical engineer / Ordering NPWT, Apligraf, etc. _16  - 0 Emergency Hospital Admission (emergent condition) X- 1 10 Simple Discharge Coordination [name], Theresa Hansen. ([id number]) _17  - 0 Complex (extensive) Discharge Coordination PROCESS - Special Needs _18  - Pediatric / Minor Patient Management 0 _19  - 0 Isolation Patient Management _20  - 0 Hearing / Language / Visual special needs _21  - 0 Assessment of Community assistance (transportation, D/C planning, etc.) _22  - 0 Additional assistance  / Altered mentation _23  - 0 Support Surface(s) Assessment (bed, cushion, seat, etc.) INTERVENTIONS - Wound Cleansing / Measurement X - Simple Wound Cleansing - one wound 1 5 _24  - 0 Complex Wound Cleansing - multiple  wounds X- 1 5 Wound Imaging (photographs - any number of wounds) _0  - 0 Wound Tracing (instead of photographs) X- 1 5 Simple Wound Measurement - one wound _1  - 0 Complex Wound Measurement - multiple wounds INTERVENTIONS - Wound Dressings _2  - Small Wound Dressing one or multiple wounds 0 X- 1 15 Medium Wound Dressing one or multiple wounds _3  - 0 Large Wound Dressing one or multiple wounds <TFTDDUKGURKYHCWC>_3<\/JSEGBTDVVOHYWVPX>_1  - 0 Application of Medications - topical <GGYIRSWNIOEVOJJK>_0<\/XFGHWEXHBZJIRCVE>_9  - 0 Application of Medications - injection INTERVENTIONS - Miscellaneous _6  - External ear exam 0 _7  - 0 Specimen Collection (cultures, biopsies, blood, body fluids, etc.) _8  - 0 Specimen(s) / Culture(s) sent or taken to Lab for analysis _9  - 0 Patient Transfer (multiple staff / Civil Service fast streamer / Similar devices) _10  - 0 Simple Staple / Suture removal (25 or less) _11  - 0 Complex Staple / Suture removal (26 or more) _12  - 0 Hypo / Hyperglycemic Management (close monitor of Blood Glucose) _13  - 0 Ankle / Brachial Index (ABI) - do not check if billed separately X- 1 5 Vital Signs Hockenbury, Seylah M. ([id number]) Has the patient been seen at the hospital within the last three years: Yes Total Score: 70 Level Of Care: New/Established - Level 2 Electronic Signature(s) Signed: 12/26/2017 6:04:06 PM By: Gretta Cool, BSN, RN, CWS, Kim RN, BSN Entered By: Gretta Cool, BSN, RN, CWS, Kim on 12/26/2017 18:00:35 Theresa Hansen ([id number]) -------------------------------------------------------------------------------- Encounter Discharge Information Details Patient Name: Theresa Hansen Date of Service: 12/26/2017 3:45 PM Medical Record Number: [id number] Patient Account Number: [id number] Date of Birth/Sex: 1995/10/02 (22 y.o. F) Treating RN:  Roger Shelter Primary Care Mirakle Tomlin: Caryl Pina Other Clinician: Referring French Kendra: Caryl Pina Treating Dajiah Kooi/Extender: Cathie Olden in Treatment: 7 Encounter Discharge Information Items Discharge Condition: Stable Ambulatory Status: Ambulatory Discharge Destination: Home Transportation: Private Auto Accompanied By: mom Schedule Follow-up Appointment: Yes Clinical Summary of Care: Electronic Signature(s) Signed: 12/27/2017 9:21:22 AM By: Roger Shelter Entered By: Roger Shelter on 12/26/2017 16:51:52 Theresa Hansen ([id number]) -------------------------------------------------------------------------------- Lower Extremity Assessment Details Patient Name: Theresa Hansen Date of Service: 12/26/2017 3:45 PM Medical Record Number: [id number] Patient Account Number: [id number] Date of Birth/Sex: Jun 04, 1995 (22 y.o. F) Treating RN: Montey Hora Primary Care Willie Loy: Caryl Pina Other Clinician: Referring Anouk Critzer: Caryl Pina Treating Orville Widmann/Extender: Cathie Olden in Treatment: 7 Electronic Signature(s) Signed: 12/26/2017 4:48:53 PM By: Montey Hora Entered By: Montey Hora on 12/26/2017 16:26:11 Theresa Hansen ([id number]) -------------------------------------------------------------------------------- Multi Wound Chart Details Patient Name: Theresa Hansen Date of Service: 12/26/2017 3:45 PM Medical Record Number: [id number] Patient Account Number: [id number] Date of Birth/Sex: March 10, 1996 (22 y.o. F) Treating RN: Cornell Barman Primary Care Noela Brothers: Caryl Pina Other Clinician: Referring Dejanira Pamintuan: Caryl Pina Treating Sher Shampine/Extender: Cathie Olden in Treatment: 7 Vital Signs Height(in): 64 Pulse(bpm): 92 Weight(lbs): 149 Blood Pressure(mmHg): 116/69 Body Mass Index(BMI): 26 Temperature(F): 98.3 Respiratory Rate 16 (breaths/min): Photos: [N/A:N/A] Wound Location: Right Abdomen -  Lower Left Abdomen - Lower N/A Quadrant Quadrant Wounding Event: Surgical Injury Surgical Injury N/A Primary Etiology: Dehisced Wound Dehisced Wound N/A Date Acquired: 09/03/2017 09/03/2017 N/A Weeks of Treatment: 7 7 N/A Wound Status: Open Open N/A Measurements L x W x D 0.3x1.6x1 0.1x0.1x2.6 N/A (cm) Area (cm) : 0.377 0.008 N/A Volume (cm) : 0.377 0.02 N/A % Reduction in Area: 81.50% 92.70% N/A % Reduction in Volume: 85.80% 69.70% N/A Classification: Full Thickness With Exposed Full Thickness Without N/A Support Structures Exposed Support Structures Exudate Amount: Small Small N/A Exudate Type: Serous Serosanguineous  N/A Exudate Color: amber red, brown N/A Wound Margin: Distinct, outline attached Distinct, outline attached N/A Granulation Amount: Large (67-100%) Large (67-100%) N/A Granulation Quality: Red Red N/A Necrotic Amount: None Present (0%) None Present (0%) N/A Exposed Structures: Fascia: No Fascia: No N/A Fat Layer (Subcutaneous Fat Layer (Subcutaneous Tissue) Exposed: No Tissue) Exposed: No Tendon: No Tendon: No Muscle: No Muscle: No Joint: No Joint: No Bone: No Bone: No Forse, Amea M. ([id number]) Epithelialization: None None N/A Periwound Skin Texture: Excoriation: No Excoriation: No N/A Induration: No Induration: No Callus: No Callus: No Crepitus: No Crepitus: No Rash: No Rash: No Scarring: No Scarring: No Periwound Skin Moisture: Maceration: No Maceration: No N/A Dry/Scaly: No Dry/Scaly: No Periwound Skin Color: Atrophie Blanche: No Atrophie Blanche: No N/A Cyanosis: No Cyanosis: No Ecchymosis: No Ecchymosis: No Erythema: No Erythema: No Hemosiderin Staining: No Hemosiderin Staining: No Mottled: No Mottled: No Pallor: No Pallor: No Rubor: No Rubor: No Temperature: No Abnormality No Abnormality N/A Tenderness on Palpation: Yes Yes N/A Wound Preparation: Ulcer Cleansing: Ulcer Cleansing: N/A Rinsed/Irrigated with Saline  Rinsed/Irrigated with Saline Topical Anesthetic Applied: Topical Anesthetic Applied: None, Other: lidocaine 4% None, Other: lidocaine 4% Treatment Notes Wound #1 (Right Abdomen - Lower Quadrant) 1. Cleansed with: Clean wound with Normal Saline 2. Anesthetic Topical Lidocaine 4% cream to wound bed prior to debridement 4. Dressing Applied: Prisma Ag Iodoform packing Gauze 5. Secondary Dressing Applied Bordered Foam Dressing Wound #2 (Left Abdomen - Lower Quadrant) 1. Cleansed with: Clean wound with Normal Saline 2. Anesthetic Topical Lidocaine 4% cream to wound bed prior to debridement 4. Dressing Applied: Prisma Ag Iodoform packing Gauze 5. Secondary Dressing Applied Bordered Foam Dressing Electronic Signature(s) Signed: 12/26/2017 4:54:12 PM By: Lawanda Cousins Entered By: Lawanda Cousins on 12/26/2017 16:54:12 Theresa Hansen ([id number]) -------------------------------------------------------------------------------- [place] Details Patient Name: Theresa Hansen Date of Service: 12/26/2017 3:45 PM Medical Record Number: [id number] Patient Account Number: [id number] Date of Birth/Sex: [birth year] (22 y.o. F) Treating RN: Cornell Barman Primary Care Addalee Kavanagh: Caryl Pina Other Clinician: Referring Summer Mccolgan: Caryl Pina Treating Tate Zagal/Extender: Cathie Olden in Treatment: 7 Active Inactive ` Orientation to the Wound Care Program Nursing Diagnoses: Knowledge deficit related to the wound healing center program Goals: Patient/caregiver will verbalize understanding of the [place] Program Date Initiated: 11/03/2017 Target Resolution Date: 01/20/2018 Goal Status: Active Interventions: Provide education on orientation to the wound center Notes: ` Pain, Acute or Chronic Nursing Diagnoses: Potential alteration in comfort, pain Goals: Patient/caregiver will verbalize comfort level met Date Initiated: 12/11/2017 Target  Resolution Date: 12/06/2017 Goal Status: Active Interventions: Complete pain assessment as per visit requirements Treatment Activities: Administer pain control measures as ordered : 12/06/2017 Notes: ` Wound/Skin Impairment Nursing Diagnoses: Impaired tissue integrity Goals: Ulcer/skin breakdown will heal within 14 weeks Date Initiated: 11/03/2017 Target Resolution Date: 01/20/2018 EMILYANN, BANKA ([id number]) Goal Status: Active Interventions: Assess patient/caregiver ability to obtain necessary supplies Assess patient/caregiver ability to perform ulcer/skin care regimen upon admission and as needed Assess ulceration(s) every visit Notes: Electronic Signature(s) Signed: 12/26/2017 6:04:06 PM By: Gretta Cool, BSN, RN, CWS, Kim RN, BSN Entered By: Gretta Cool, BSN, RN, CWS, Kim on 12/26/2017 16:34:01 Theresa Hansen ([id number]) -------------------------------------------------------------------------------- Pain Assessment Details Patient Name: Theresa Hansen Date of Service: 12/26/2017 3:45 PM Medical Record Number: [id number] Patient Account Number: [id number] Date of Birth/Sex: 1996-03-09 (22 y.o. F) Treating RN: Montey Hora Primary Care Edwena Mayorga: Caryl Pina Other Clinician: Referring Ahmeer Tuman: Caryl Pina Treating Kinsie Belford/Extender: Cathie Olden in Treatment: 7 Active Problems Location  of Pain Severity and Description of Pain Patient Has Paino No Site Locations Pain Management and Medication Current Pain Management: Electronic Signature(s) Signed: 12/26/2017 4:48:53 PM By: Montey Hora Entered By: Montey Hora on 12/26/2017 16:18:34 Theresa Hansen ([id number]) -------------------------------------------------------------------------------- Patient/Caregiver Education Details Patient Name: Theresa Hansen Date of Service: 12/26/2017 3:45 PM Medical Record Number: [id number] Patient Account Number: [id number] Date of Birth/Gender: 1995/12/19 (22  y.o. F) Treating RN: Roger Shelter Primary Care Physician: Caryl Pina Other Clinician: Referring Physician: Caryl Pina Treating Physician/Extender: Cathie Olden in Treatment: 7 Education Assessment Education Provided To: Patient Education Topics Provided Wound/Skin Impairment: Handouts: Caring for Your Ulcer Methods: Explain/Verbal Responses: State content correctly Electronic Signature(s) Signed: 12/27/2017 9:21:22 AM By: Roger Shelter Entered By: Roger Shelter on 12/26/2017 16:52:05 Theresa Hansen ([id number]) -------------------------------------------------------------------------------- Wound Assessment Details Patient Name: Theresa Hansen Date of Service: 12/26/2017 3:45 PM Medical Record Number: [id number] Patient Account Number: [id number] Date of Birth/Sex: 1995/09/10 (22 y.o. F) Treating RN: Montey Hora Primary Care Rocio Roam: Caryl Pina Other Clinician: Referring Shalin Linders: Caryl Pina Treating Jalene Demo/Extender: Cathie Olden in Treatment: 7 Wound Status Wound Number: 1 Primary Etiology: Dehisced Wound Wound Location: Right Abdomen - Lower Quadrant Wound Status: Open Wounding Event: Surgical Injury Date Acquired: 09/03/2017 Weeks Of Treatment: 7 Clustered Wound: No Photos Photo Uploaded By: Montey Hora on 12/26/2017 16:40:30 Wound Measurements Length: (cm) 0.3 Width: (cm) 1.6 Depth: (cm) 1 Area: (cm) 0.377 Volume: (cm) 0.377 % Reduction in Area: 81.5% % Reduction in Volume: 85.8% Epithelialization: None Tunneling: No Undermining: No Wound Description Full Thickness With Exposed Support Classification: Structures Wound Margin: Distinct, outline attached Exudate Small Amount: Exudate Type: Serous Exudate Color: amber Foul Odor After Cleansing: No Slough/Fibrino No Wound Bed Granulation Amount: Large (67-100%) Exposed Structure Granulation Quality: Red Fascia Exposed: No Necrotic  Amount: None Present (0%) Fat Layer (Subcutaneous Tissue) Exposed: No Tendon Exposed: No Muscle Exposed: No Joint Exposed: No Bone Exposed: No Riggenbach, Brenlynn M. ([id number]) Periwound Skin Texture Texture Color No Abnormalities Noted: No No Abnormalities Noted: No Callus: No Atrophie Blanche: No Crepitus: No Cyanosis: No Excoriation: No Ecchymosis: No Induration: No Erythema: No Rash: No Hemosiderin Staining: No Scarring: No Mottled: No Pallor: No Moisture Rubor: No No Abnormalities Noted: No Dry / Scaly: No Temperature / Pain Maceration: No Temperature: No Abnormality Tenderness on Palpation: Yes Wound Preparation Ulcer Cleansing: Rinsed/Irrigated with Saline Topical Anesthetic Applied: None, Other: lidocaine 4%, Treatment Notes Wound #1 (Right Abdomen - Lower Quadrant) 1. Cleansed with: Clean wound with Normal Saline 2. Anesthetic Topical Lidocaine 4% cream to wound bed prior to debridement 4. Dressing Applied: Prisma Ag Iodoform packing Gauze 5. Secondary Dressing Applied Bordered Foam Dressing Electronic Signature(s) Signed: 12/26/2017 4:48:53 PM By: Montey Hora Entered By: Montey Hora on 12/26/2017 16:25:30 Theresa Hansen ([id number]) -------------------------------------------------------------------------------- Wound Assessment Details Patient Name: Theresa Hansen Date of Service: 12/26/2017 3:45 PM Medical Record Number: [id number] Patient Account Number: [id number] Date of Birth/Sex: [birth year] (22 y.o. F) Treating RN: Montey Hora Primary Care Tayli Buch: Caryl Pina Other Clinician: Referring Keaghan Staton: Caryl Pina Treating Jyren Cerasoli/Extender: Cathie Olden in Treatment: 7 Wound Status Wound Number: 2 Primary Etiology: Dehisced Wound Wound Location: Left Abdomen - Lower Quadrant Wound Status: Open Wounding Event: Surgical Injury Date Acquired: 09/03/2017 Weeks Of Treatment: 7 Clustered Wound: No Photos Photo  Uploaded By: Montey Hora on 12/26/2017 16:40:31 Wound Measurements Length: (cm) 0.1 Width: (cm) 0.1 Depth: (cm) 2.6 Area: (cm) 0.008 Volume: (cm) 0.02 % Reduction in Area: 92.7% % Reduction in Volume:  69.7% Epithelialization: None Tunneling: No Undermining: No Wound Description Full Thickness Without Exposed Support Classification: Structures Wound Margin: Distinct, outline attached Exudate Small Amount: Exudate Type: Serosanguineous Exudate Color: red, brown Foul Odor After Cleansing: No Slough/Fibrino No Wound Bed Granulation Amount: Large (67-100%) Exposed Structure Granulation Quality: Red Fascia Exposed: No Necrotic Amount: None Present (0%) Fat Layer (Subcutaneous Tissue) Exposed: No Tendon Exposed: No Muscle Exposed: No Joint Exposed: No Bone Exposed: No Lecrone, Torrence M. ([id number]) Periwound Skin Texture Texture Color No Abnormalities Noted: No No Abnormalities Noted: No Callus: No Atrophie Blanche: No Crepitus: No Cyanosis: No Excoriation: No Ecchymosis: No Induration: No Erythema: No Rash: No Hemosiderin Staining: No Scarring: No Mottled: No Pallor: No Moisture Rubor: No No Abnormalities Noted: No Dry / Scaly: No Temperature / Pain Maceration: No Temperature: No Abnormality Tenderness on Palpation: Yes Wound Preparation Ulcer Cleansing: Rinsed/Irrigated with Saline Topical Anesthetic Applied: None, Other: lidocaine 4%, Treatment Notes Wound #2 (Left Abdomen - Lower Quadrant) 1. Cleansed with: Clean wound with Normal Saline 2. Anesthetic Topical Lidocaine 4% cream to wound bed prior to debridement 4. Dressing Applied: Prisma Ag Iodoform packing Gauze 5. Secondary Dressing Applied Bordered Foam Dressing Electronic Signature(s) Signed: 12/26/2017 4:48:53 PM By: Montey Hora Entered By: Montey Hora on 12/26/2017 16:23:39 Theresa Hansen  ([id number]) -------------------------------------------------------------------------------- [place] Details Patient Name: Theresa Hansen Date of Service: 12/26/2017 3:45 PM Medical Record Number: [id number] Patient Account Number: [id number] Date of Birth/Sex: [birth year] (22 y.o. F) Treating RN: Montey Hora Primary Care Hollan Philipp: Caryl Pina Other Clinician: Referring Luma Clopper: Caryl Pina Treating Marris Frontera/Extender: Cathie Olden in Treatment: 7 Vital Signs Time Taken: 16:18 Temperature (F): 98.3 Height (in): 64 Pulse (bpm): 92 Weight (lbs): 149 Respiratory Rate (breaths/min): 16 Body Mass Index (BMI): 25.6 Blood Pressure (mmHg): 116/69 Reference Range: 80 - 120 mg / dl Electronic Signature(s) Signed: 12/26/2017 4:48:53 PM By: Montey Hora Entered By: Montey Hora on 12/26/2017 16:19:10

## 2018-01-07 NOTE — Progress Notes (Signed)
Theresa, Hansen ([id number]) Visit Report for 12/26/2017 Chief Complaint Document Details Patient Name: Theresa Hansen, Theresa Hansen. Date of Service: 12/26/2017 3:45 PM Medical Record Number: [id number] Patient Account Number: [id number] Date of Birth/Sex: [birth year] (21 y.o. F) Treating RN: Phillis Haggis Primary Care Provider: Arville Care Other Clinician: Referring Provider: Arville Care Treating Provider/Extender: Kathreen Cosier in Treatment: 7 Information Obtained from: Patient Chief Complaint Abdominal ulcers s/p c-section January 2019 with dehiscence April 2019 Electronic Signature(s) Signed: 12/26/2017 4:54:28 PM By: Bonnell Public Entered By: Bonnell Public on 12/26/2017 16:54:28 Theresa Hansen ([id number]) -------------------------------------------------------------------------------- HPI Details Patient Name: Theresa Hansen Date of Service: 12/26/2017 3:45 PM Medical Record Number: [id number] Patient Account Number: [id number] Date of Birth/Sex: 09-Jun-1995 (21 y.o. F) Treating RN: Phillis Haggis Primary Care Provider: Arville Care Other Clinician: Referring Provider: Arville Care Treating Provider/Extender: Kathreen Cosier in Treatment: 7 History of Present Illness HPI Description: 11/03/17 on evaluation today patient presents for initial evaluation concerning two wounds on each the lateral aspect of her abdominal C-section incision. This surgery was actually performed January 2019 and apparently went fairly well although the reason for the C-section was secondary to arrest of dilation at 9 cm. Fortunately delivery went well but unfortunately her postpartum course is complicated by cholecystitis requiring emergent gallbladder removal. Per the patient the surgeon who performed the surgery stated that "this was the worst gallbladder they had ever seen" subsequently following that surgery she was also doing well. On 20 January when she was seen for a  postpartum visit incision looked excellent with no evidence of opening at that point. However on August 23, 2017 she presented back to the OB/GYN office due to a small opening that had occurred at the incision site. There was no fever, chills, she had no significant bleeding and no, pain. With that being said I did agree with the note which was reviewed although I did review multiple notes where it was stated that this was an unusual course and that it appears ace aroma developed but did not show itself until three months after the initial surgery. Unfortunately following that August 23, 2017 visit the patient subsequently goes through several visits which I did review the records of as well where she basically had one side which would open and then that would be treated the other side was closed and then the opposite will happen with the opposite side opening in the initial side closing. This continued until things appear to be almost completely sealed at the end of May 2019 only to unfortunately completely open more significantly on both sides at which point she was referred to wound care for further evaluation and treatment. This is the state in which I see her today upon my initial evaluation. There does not appear to be any fevers, chills, nausea, vomiting, or diarrhea. The patient really has no major medical problems other than the wound which is given her enough trouble she states. She does take Tylenol as needed she is bottlefeeding at this point and she also takes protonix 20 mg daily. Overall she does have some discomfort but mainly because cleansing or touching wound location. 11/10/17 on evaluation today patient appears to actually be doing a little bit worse in regard to her abdominal ulcer in my pinion. This seems to be a little bit deeper than was previously noted during the last evaluation which has me somewhat concerned. Especially with the abnormal history as far as the presentation  and how this developed 2-3 months after  the original surgery. Nonetheless I'm not sure exactly what is going on I'm concerned about the possibility of a fistula. This was discussed with patient and her mother today I believe we may want to have her see a surgeon to discuss any further evaluation in this regard. 11/15/17; patient arrived in my clinic today. I've reviewed our previous notes and essentially agree with a history which I reviewed with the patient today. this was apparently opened 2-3 months after the original surgery.significant original drainage. She does not currently complain of pain, fever or chills drainage. She states that it wasn't for the wounds she wouldn't really have any symptoms at all. She apparently has a appointment made by her clinic with an independent general surgeon. Our intake nurse reports that the wounds have deteriorated. 11/29/17; patient with nonhealing surgical areas on the lower abdominal C-section area. Clearly these areas connect. Ultrasound that I ordered did not show a fluid collection or abscess. Culture showed a few MSSA. I prescribed cephalexin and because [place] was [place] the beach she only picked these up yesterday however she is remains systemically well. No fever chills abdominal pain change in bowel habits etc. [facility] which is something we already should've suspected in any case she'll probably get a standard KCI VAC 12/06/17; she is still taking the Keflex without issue. Discharge today with a standard KCI wound VAC 12/13/17; [facility] complaining of irritation in her upper abdomen where the VAC tubing was adhered with tape. She also has some irritation around the actual wounds which looks fungal. In spite of this the tunneling is quite a bit better 12/26/17-She is seen in follow-up evaluation for 2 abdominal wounds within her C-section incision. The left side has a  pinpoint opening with waxing and waning depth. She has not tolerated the vac drape and therefore will discontinue wound vac. There is no evidence of fecal output, although fistulous tract is always a concern. We discussed the ultrasound report with two small fat containing hernias and consult with general surgery for possible repair. Theresa FinnerBULLINS, Anureet M. ([id number]) Electronic Signature(s) Signed: 12/26/2017 5:11:24 PM By: Bonnell Publicoulter, Sharalyn Lomba Previous Signature: 12/26/2017 4:56:11 PM Version By: Bonnell Publicoulter, Sanaa Zilberman Entered By: Bonnell Publicoulter, Kie Calvin on 12/26/2017 17:11:24 Theresa FinnerBULLINS, Decarla M. ([id number]) -------------------------------------------------------------------------------- Physician Orders Details Patient Name: Theresa FinnerBULLINS, Shamiyah M. Date of Service: 12/26/2017 3:45 PM Medical Record Number: [id number] Patient Account Number: [id number] Date of Birth/Sex: 1995-07-28 (21 y.o. F) Treating RN: Huel CoventryWoody, Kim Primary Care Provider: Arville CareETTINGER, JOSHUA Other Clinician: Referring Provider: Arville CareETTINGER, JOSHUA Treating Provider/Extender: Kathreen Cosieroulter, Rayvn Rickerson Weeks in Treatment: 7 Verbal / Phone Orders: No Diagnosis Coding Wound Cleansing Wound #1 Right Abdomen - Lower Quadrant o Clean wound with Normal Saline. Wound #2 Left Abdomen - Lower Quadrant o Clean wound with Normal Saline. Skin Barriers/Peri-Wound Care Wound #1 Right Abdomen - Lower Quadrant o Skin Prep Wound #2 Left Abdomen - Lower Quadrant o Skin Prep Primary Wound Dressing Wound #1 Right Abdomen - Lower Quadrant o Silver Collagen o Iodoform packing Gauze Wound #2 Left Abdomen - Lower Quadrant o Silver Collagen o Iodoform packing Gauze Secondary Dressing Wound #1 Right Abdomen - Lower Quadrant o ABD pad Wound #2 Left Abdomen - Lower Quadrant o ABD pad Dressing Change Frequency Wound #1 Right Abdomen - Lower Quadrant o Change dressing every day. Wound #2 Left Abdomen - Lower Quadrant o Change dressing every day. Electronic  Signature(s) Signed: 12/26/2017 5:29:48 PM By: Bonnell Publicoulter, Bashir Marchetti Signed:  12/26/2017 6:04:06 PM By: Elliot Gurney, BSN, RN, CWS, Kim RN, BSN Entered By: Elliot Gurney, BSN, RN, CWS, Kim on 12/26/2017 16:34:56 MONZERRATH, MCBURNEY ([id number]) CATHIE, BONNELL ([id number]) -------------------------------------------------------------------------------- Problem List Details Patient Name: Theresa Hansen Date of Service: 12/26/2017 3:45 PM Medical Record Number: [id number] Patient Account Number: [id number] Date of Birth/Sex: 1995-10-08 (21 y.o. 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 F) Treating RN: Phillis Haggis Primary Care Provider: Arville Care Other Clinician: Referring Provider: Arville Care Treating Provider/Extender: Kathreen Cosier in Treatment: 7 Subjective Chief Complaint Information obtained from Patient Abdominal ulcers s/p c-section January 2019 with dehiscence April 2019 History of Present Illness (HPI) 11/03/17 on evaluation today patient presents for initial  evaluation concerning two wounds on each the lateral aspect of her abdominal C-section incision. This surgery was actually performed January 2019 and apparently went fairly well although the reason for the C-section was secondary to arrest of dilation at 9 cm. Fortunately delivery went well but unfortunately her postpartum course is complicated by cholecystitis requiring emergent gallbladder removal. Per the patient the surgeon who performed the surgery stated that "this was the worst gallbladder they had ever seen" subsequently following that surgery she was also doing well. On 20 January when she was seen for a postpartum visit incision looked excellent with no evidence of opening at that point. However on August 23, 2017 she presented back to the OB/GYN office due to a small opening that had occurred at the incision site. There was no fever, chills, she had no significant bleeding and no, pain. With that being said I did agree with the note which was reviewed although I did review multiple notes where it was stated that this was an unusual course and that it appears ace aroma developed but did not show itself until three months after the initial surgery. Unfortunately following that August 23, 2017 visit the patient subsequently goes through several visits which I did review the records of as well where she basically had one side which would open and then that would be treated the other side was closed and then the opposite will happen with the opposite side opening in the initial side closing. This continued until things appear to be almost completely sealed at the end of May 2019 only to unfortunately completely open more significantly on both sides at which point she was referred to wound care for further evaluation and treatment. This is the state in which I see her today upon my initial evaluation. There does not appear to be any fevers, chills, nausea, vomiting, or diarrhea. The patient really  has no major medical problems other than the wound which is given her enough trouble she states. She does take Tylenol as needed she is bottlefeeding at this point and she also takes protonix 20 mg daily. Overall she does have some discomfort but mainly  because cleansing or touching wound location. 11/10/17 on evaluation today patient appears to actually be doing a little bit worse in regard to her abdominal ulcer in my pinion. This seems to be a little bit deeper than was previously noted during the last evaluation which has me somewhat concerned. Especially with the abnormal history as far as the presentation and how this developed 2-3 months after the original surgery. Nonetheless I'm not sure exactly what is going on I'm concerned about the possibility of a fistula. This was discussed with patient and her mother today I believe we may want to have her see a surgeon to discuss any further evaluation in this regard. 11/15/17; patient arrived in my clinic today. I've reviewed our previous notes and essentially agree with a history which I reviewed with the patient today. this was apparently opened 2-3 months after the original surgery.significant original drainage. She does not currently complain of pain, fever or chills drainage. She states that it wasn't for the wounds she wouldn't really have any symptoms at all. She apparently has a appointment made by her clinic with an independent general surgeon. Our intake nurse reports that the wounds have deteriorated. 11/29/17; patient with nonhealing surgical areas on the lower abdominal C-section area. Clearly these areas connect. Ultrasound that I ordered did not show a fluid collection or abscess. Culture showed a few MSSA. I prescribed cephalexin and because [place] was at the beach she only picked these up yesterday however she is remains systemically well. No fever chills abdominal pain change in bowel habits etc. [facility] will not cover a  snap VAC which is something we already should've suspected in any case she'll probably get a standard KCI VAC 12/06/17; she is still taking the Keflex without issue. Discharge today with a standard KCI wound VAC 12/13/17; the patient took the [facility] off last night predominantly complaining of irritation in her upper abdomen where the [facility] Koy, Helvi M. ([id number]) tubing was adhered with tape. She also has some irritation around the actual wounds which looks fungal. In spite of this the tunneling is quite a bit better 12/26/17-She is seen in follow-up evaluation for 2 abdominal wounds within her C-section incision. The left side has a pinpoint opening with waxing and waning depth. She has not tolerated the vac drape and therefore will discontinue wound vac. There is no evidence of fecal output, although fistulous tract is always a concern. We discussed the ultrasound report with two small fat containing hernias and consult with general surgery for possible repair. Objective Constitutional Vitals Time Taken: 4:18 PM, Height: 64 in, Weight: 149 lbs, BMI: 25.6, Temperature: 98.3 F, Pulse: 92 bpm, Respiratory Rate: 16 breaths/min, Blood Pressure: 116/69 mmHg. Integumentary (Hair, Skin) Wound #1 status is Open. Original cause of wound was Surgical Injury. The wound is located on the Right Abdomen - Lower Quadrant. The wound measures 0.3cm length x 1.6cm width x 1cm depth; 0.377cm^2 area and 0.377cm^3 volume. There is no tunneling or undermining noted. There is a small amount of serous drainage noted. The wound margin is distinct with the outline attached to the wound base. There is large (67-100%) red granulation within the wound bed. There is no necrotic tissue within the wound bed. The periwound skin appearance did not exhibit: Callus, Crepitus, Excoriation, Induration, Rash, Scarring, Dry/Scaly, Maceration, Atrophie Blanche, Cyanosis, Ecchymosis, Hemosiderin Staining, Mottled, Pallor,  Rubor, Erythema. Periwound temperature was noted as No Abnormality. The periwound has tenderness on palpation. Wound #2 status is Open. Original  cause of wound was Surgical Injury. The wound is located on the Left Abdomen - Lower Quadrant. The wound measures 0.1cm length x 0.1cm width x 2.6cm depth; 0.008cm^2 area and 0.02cm^3 volume. There is no tunneling or undermining noted. There is a small amount of serosanguineous drainage noted. The wound margin is distinct with the outline attached to the wound base. There is large (67-100%) red granulation within the wound bed. There is no necrotic tissue within the wound bed. The periwound skin appearance did not exhibit: Callus, Crepitus, Excoriation, Induration, Rash, Scarring, Dry/Scaly, Maceration, Atrophie Blanche, Cyanosis, Ecchymosis, Hemosiderin Staining, Mottled, Pallor, Rubor, Erythema. Periwound temperature was noted as No Abnormality. The periwound has tenderness on palpation. Assessment Active Problems ICD-10 Disruption of external operation (surgical) wound, not elsewhere classified, sequela Non-pressure chronic ulcer of skin of other sites with fat layer exposed Plan Wound Cleansing: ATLEE, VILLERS. ([id number]) Wound #1 Right Abdomen - Lower Quadrant: Clean wound with Normal Saline. Wound #2 Left Abdomen - Lower Quadrant: Clean wound with Normal Saline. Skin Barriers/Peri-Wound Care: Wound #1 Right Abdomen - Lower Quadrant: Skin Prep Wound #2 Left Abdomen - Lower Quadrant: Skin Prep Primary Wound Dressing: Wound #1 Right Abdomen - Lower Quadrant: Silver Collagen Iodoform packing Gauze Wound #2 Left Abdomen - Lower Quadrant: Silver Collagen Iodoform packing Gauze Secondary Dressing: Wound #1 Right Abdomen - Lower Quadrant: ABD pad Wound #2 Left Abdomen - Lower Quadrant: ABD pad Dressing Change Frequency: Wound #1 Right Abdomen - Lower Quadrant: Change dressing every day. Wound #2 Left Abdomen - Lower  Quadrant: Change dressing every day. Electronic Signature(s) Signed: 12/26/2017 5:18:03 PM By: Bonnell Public Entered By: Bonnell Public on 12/26/2017 17:18:03 Theresa Hansen ([id number]) -------------------------------------------------------------------------------- SuperBill Details Patient Name: Theresa Hansen Date of Service: 12/26/2017 Medical Record Number: [id number] Patient Account Number: [id number] Date of Birth/Sex: Apr 23, 1996 (21 y.o. F) Treating RN: Phillis Haggis Primary Care Provider: Arville Care Other Clinician: Referring Provider: Arville Care Treating Provider/Extender: Kathreen Cosier in Treatment: 7 Diagnosis Coding ICD-10 Codes Code Description T81.31XS Disruption of external operation (surgical) wound, not elsewhere classified, sequela L98.492 Non-pressure chronic ulcer of skin of other sites with fat layer exposed Facility Procedures CPT4 Code: 08657846 Description: 96295 - WOUND CARE VISIT-LEV 2 EST PT Modifier: Quantity: 1 Physician Procedures CPT4 Code Description: [id number] 99213 - WC PHYS LEVEL 3 - EST PT ICD-10 Diagnosis Description T81.31XS Disruption of external operation (surgical) wound, not elsewhe Modifier: re classified, s Quantity: 1 equela Electronic Signature(s) Signed: 12/27/2017 5:10:54 PM By: Bonnell Public Previous Signature: 12/26/2017 5:18:19 PM Version By: Bonnell Public Entered By: Francie Massing on 12/27/2017 11:51:24

## 2018-01-09 ENCOUNTER — Encounter: Payer: BLUE CROSS/BLUE SHIELD | Admitting: Physician Assistant

## 2018-01-09 DIAGNOSIS — L98492 Non-pressure chronic ulcer of skin of other sites with fat layer exposed: Secondary | ICD-10-CM | POA: Diagnosis not present

## 2018-01-09 DIAGNOSIS — L98499 Non-pressure chronic ulcer of skin of other sites with unspecified severity: Secondary | ICD-10-CM | POA: Diagnosis not present

## 2018-01-09 DIAGNOSIS — T8131XS Disruption of external operation (surgical) wound, not elsewhere classified, sequela: Secondary | ICD-10-CM | POA: Diagnosis not present

## 2018-01-23 ENCOUNTER — Ambulatory Visit: Payer: BLUE CROSS/BLUE SHIELD | Admitting: Physician Assistant

## 2018-02-06 ENCOUNTER — Ambulatory Visit: Payer: BLUE CROSS/BLUE SHIELD | Admitting: Physician Assistant

## 2018-03-22 DIAGNOSIS — K432 Incisional hernia without obstruction or gangrene: Secondary | ICD-10-CM | POA: Diagnosis not present

## 2020-05-11 IMAGING — US US ABDOMEN LIMITED
1 series · 11 of 11 positions shown · non-contrast
Comparison: None.

CLINICAL DATA: Surgical wound dehiscence.  C-section 5 months ago.

EXAM:
ULTRASOUND ABDOMEN LIMITED

[Series 1: us abdomen limited · 0.10mm/px · 11 acquisitions, 11 frames shown]
[im 1/11]
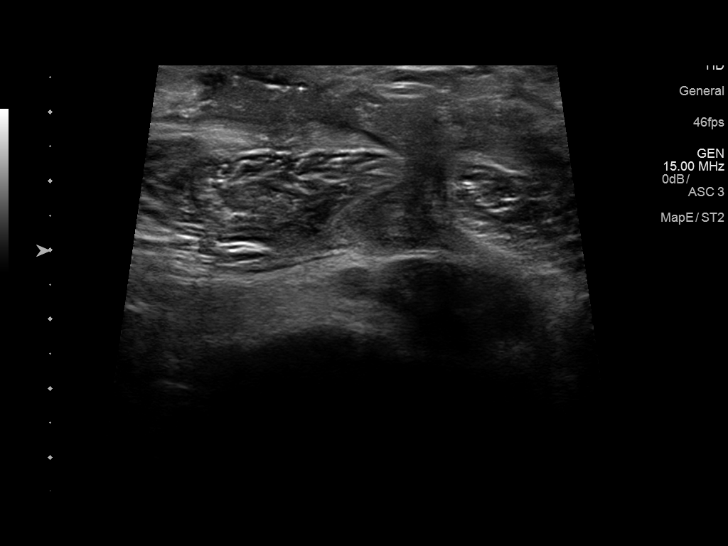
[im 2/11]
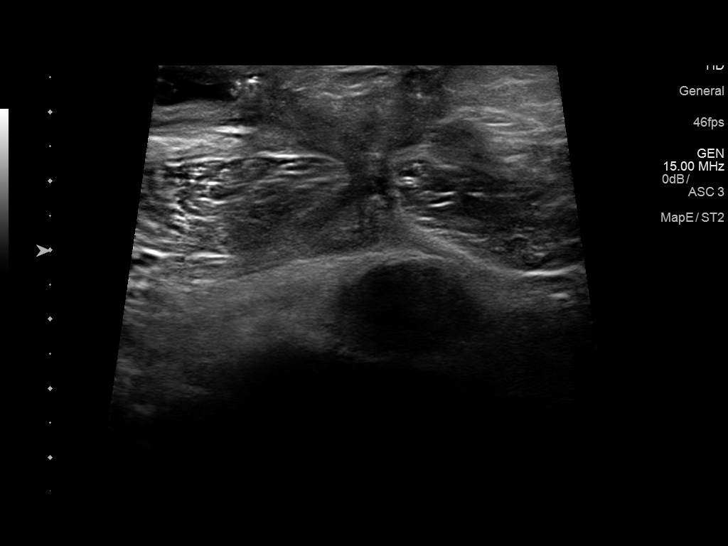
[im 3/11]
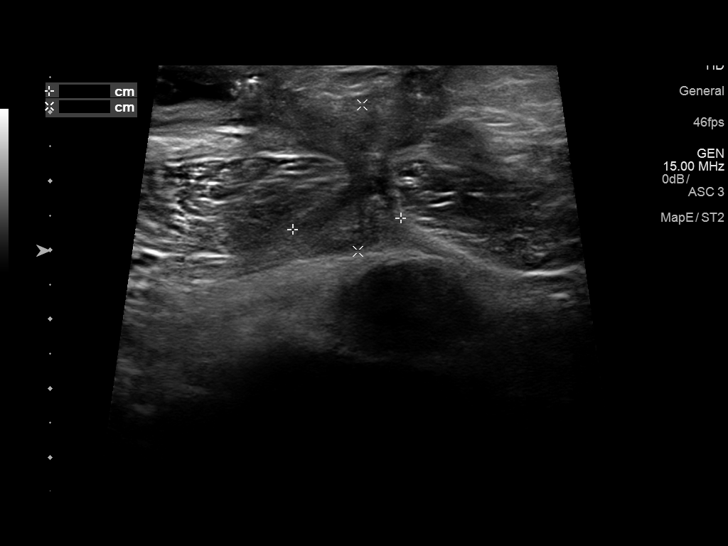
[im 4/11]
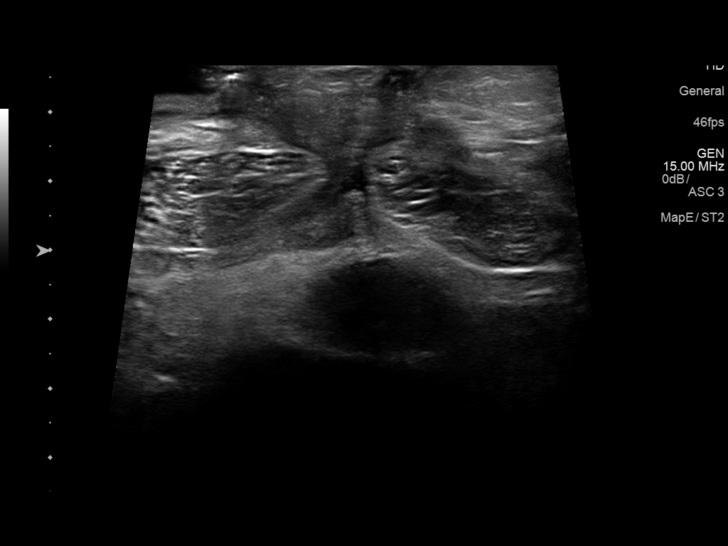
[im 5/11]
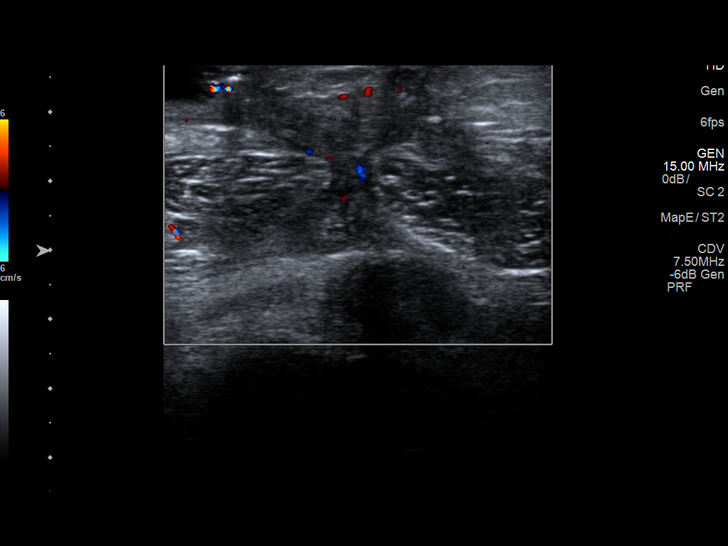
[im 6/11]
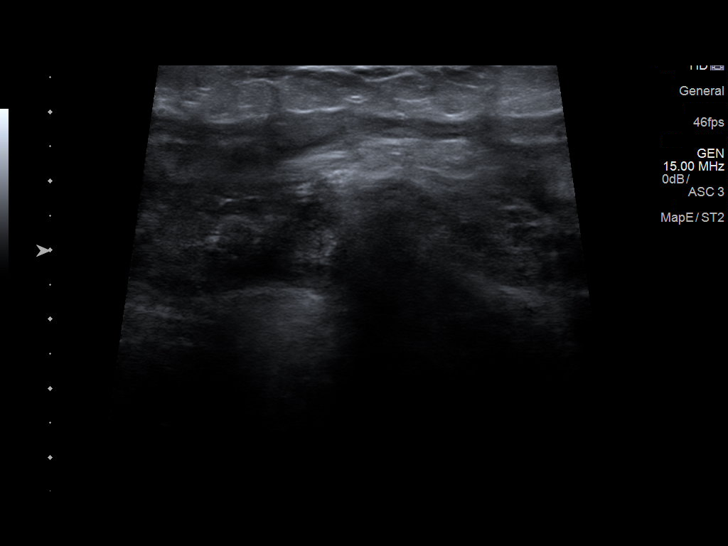
[im 7/11]
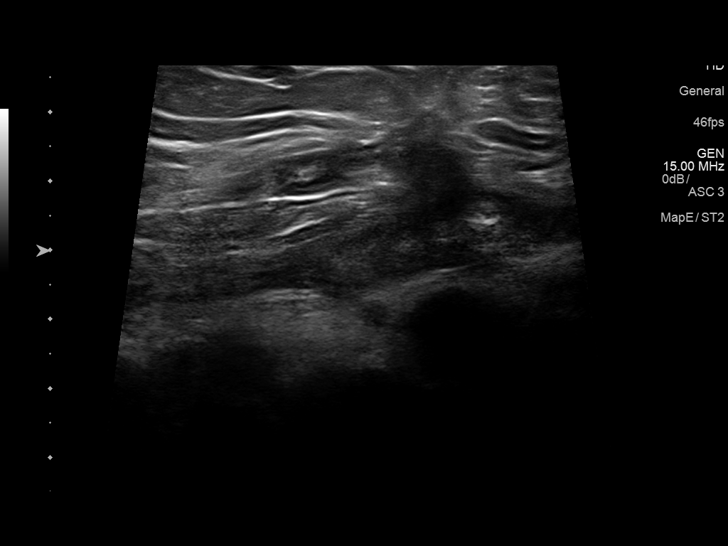
[im 8/11]
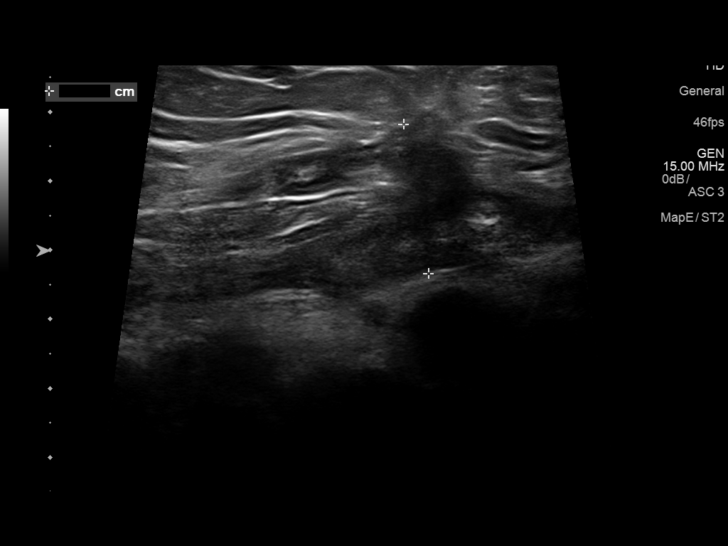
[im 9/11]
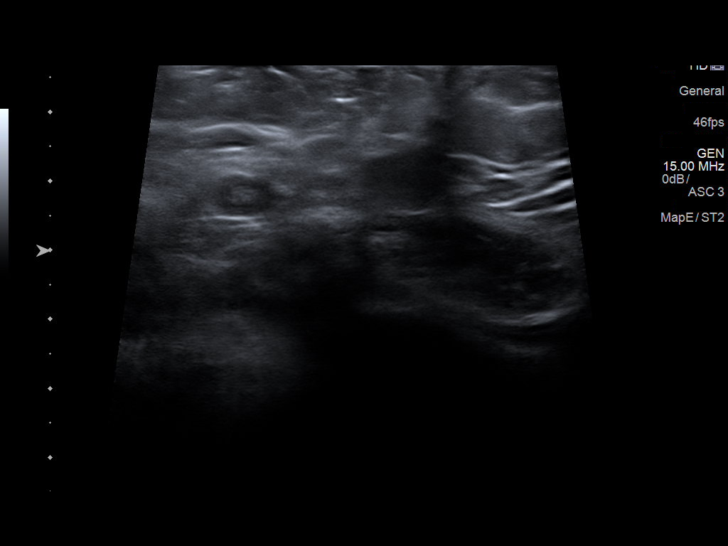
[im 10/11]
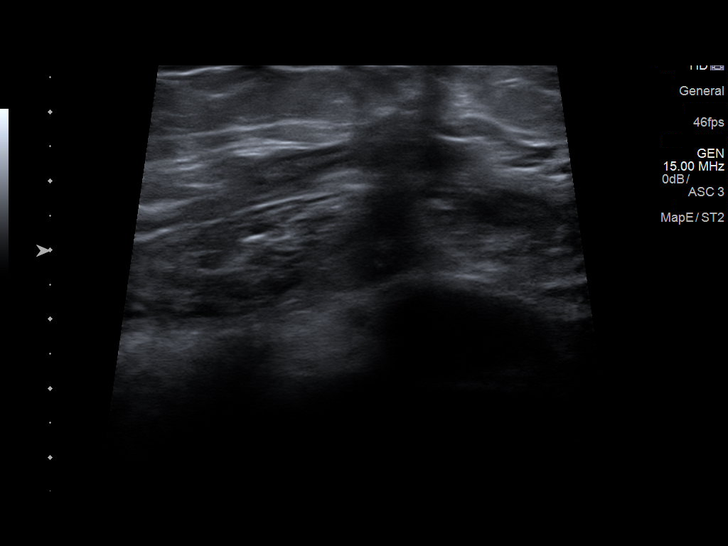
[im 11/11]
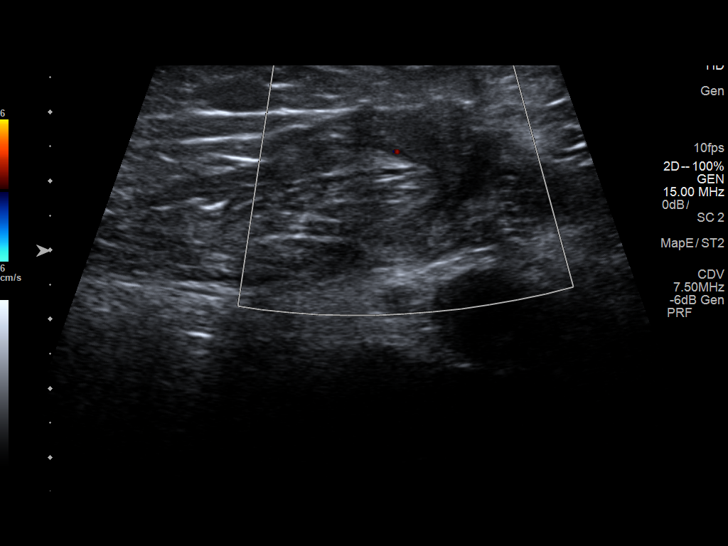

[11 of 11 positions shown; findings below may reference images not displayed]

FINDINGS: There are 2 defects in the anterior wall of the pelvis. The hernias
appear to contain fat.
IMPRESSION: There appear to be 2 fat containing hernias in the region of the
patient's symptoms.
